# Patient Record
Sex: Female | Born: 1970 | Race: White | Hispanic: No | Marital: Married | Smoking: Former smoker
Health system: Southern US, Community
[De-identification: ages and names within clinical notes are randomized; demographics above are authoritative.]

## PROBLEM LIST (undated history)

## (undated) DIAGNOSIS — G43909 Migraine, unspecified, not intractable, without status migrainosus: Secondary | ICD-10-CM

## (undated) DIAGNOSIS — Z8661 Personal history of infections of the central nervous system: Secondary | ICD-10-CM

## (undated) DIAGNOSIS — Z8619 Personal history of other infectious and parasitic diseases: Secondary | ICD-10-CM

## (undated) DIAGNOSIS — Z8632 Personal history of gestational diabetes: Secondary | ICD-10-CM

## (undated) DIAGNOSIS — F458 Other somatoform disorders: Secondary | ICD-10-CM

## (undated) DIAGNOSIS — Z8614 Personal history of Methicillin resistant Staphylococcus aureus infection: Secondary | ICD-10-CM

## (undated) DIAGNOSIS — N200 Calculus of kidney: Secondary | ICD-10-CM

## (undated) DIAGNOSIS — M7710 Lateral epicondylitis, unspecified elbow: Secondary | ICD-10-CM

## (undated) HISTORY — PX: OTHER SURGICAL HISTORY: SHX169

## (undated) HISTORY — DX: Calculus of kidney: N20.0

## (undated) HISTORY — DX: Personal history of Methicillin resistant Staphylococcus aureus infection: Z86.14

## (undated) HISTORY — DX: Personal history of infections of the central nervous system: Z86.61

## (undated) HISTORY — PX: PELVIC LAPAROSCOPY: SHX162

## (undated) HISTORY — DX: Personal history of gestational diabetes: Z86.32

## (undated) HISTORY — PX: BACK SURGERY: SHX140

## (undated) HISTORY — DX: Other somatoform disorders: F45.8

## (undated) HISTORY — DX: Migraine, unspecified, not intractable, without status migrainosus: G43.909

## (undated) HISTORY — DX: Lateral epicondylitis, unspecified elbow: M77.10

## (undated) HISTORY — DX: Personal history of other infectious and parasitic diseases: Z86.19

---

## 1997-11-23 ENCOUNTER — Other Ambulatory Visit: Admission: RE | Admit: 1997-11-23 | Discharge: 1997-11-23 | Payer: Self-pay | Admitting: Gynecology

## 1997-11-30 ENCOUNTER — Other Ambulatory Visit: Admission: RE | Admit: 1997-11-30 | Discharge: 1997-11-30 | Payer: Self-pay | Admitting: Gynecology

## 1998-04-14 ENCOUNTER — Encounter: Admission: RE | Admit: 1998-04-14 | Discharge: 1998-07-13 | Payer: Self-pay | Admitting: Gynecology

## 1998-04-20 ENCOUNTER — Inpatient Hospital Stay (HOSPITAL_COMMUNITY): Admission: AD | Admit: 1998-04-20 | Discharge: 1998-04-20 | Payer: Self-pay | Admitting: Gynecology

## 1998-05-13 ENCOUNTER — Inpatient Hospital Stay (HOSPITAL_COMMUNITY): Admission: AD | Admit: 1998-05-13 | Discharge: 1998-05-18 | Payer: Self-pay | Admitting: Gynecology

## 1998-05-29 ENCOUNTER — Inpatient Hospital Stay (HOSPITAL_COMMUNITY): Admission: AD | Admit: 1998-05-29 | Discharge: 1998-05-29 | Payer: Self-pay | Admitting: Gynecology

## 1998-06-06 ENCOUNTER — Observation Stay (HOSPITAL_COMMUNITY): Admission: AD | Admit: 1998-06-06 | Discharge: 1998-06-07 | Payer: Self-pay | Admitting: Gynecology

## 1998-06-07 ENCOUNTER — Inpatient Hospital Stay (HOSPITAL_COMMUNITY): Admission: AD | Admit: 1998-06-07 | Discharge: 1998-06-07 | Payer: Self-pay | Admitting: Gynecology

## 1998-06-08 ENCOUNTER — Inpatient Hospital Stay (HOSPITAL_COMMUNITY): Admission: AD | Admit: 1998-06-08 | Discharge: 1998-06-11 | Payer: Self-pay | Admitting: Gynecology

## 1998-07-19 ENCOUNTER — Other Ambulatory Visit: Admission: RE | Admit: 1998-07-19 | Discharge: 1998-07-19 | Payer: Self-pay | Admitting: Gynecology

## 2000-12-03 ENCOUNTER — Encounter (INDEPENDENT_AMBULATORY_CARE_PROVIDER_SITE_OTHER): Payer: Self-pay

## 2000-12-03 ENCOUNTER — Ambulatory Visit (HOSPITAL_COMMUNITY): Admission: RE | Admit: 2000-12-03 | Discharge: 2000-12-03 | Payer: Self-pay | Admitting: Gynecology

## 2002-07-08 ENCOUNTER — Other Ambulatory Visit: Admission: RE | Admit: 2002-07-08 | Discharge: 2002-07-08 | Payer: Self-pay | Admitting: Gynecology

## 2003-07-13 ENCOUNTER — Other Ambulatory Visit: Admission: RE | Admit: 2003-07-13 | Discharge: 2003-07-13 | Payer: Self-pay | Admitting: Gynecology

## 2004-07-20 ENCOUNTER — Other Ambulatory Visit: Admission: RE | Admit: 2004-07-20 | Discharge: 2004-07-20 | Payer: Self-pay | Admitting: Gynecology

## 2005-07-22 ENCOUNTER — Other Ambulatory Visit: Admission: RE | Admit: 2005-07-22 | Discharge: 2005-07-22 | Payer: Self-pay | Admitting: Gynecology

## 2005-12-04 ENCOUNTER — Encounter: Payer: Self-pay | Admitting: Gynecology

## 2007-12-30 ENCOUNTER — Other Ambulatory Visit: Admission: RE | Admit: 2007-12-30 | Discharge: 2007-12-30 | Payer: Self-pay | Admitting: Gynecology

## 2008-04-05 HISTORY — PX: ENDOMETRIAL ABLATION: SHX621

## 2008-04-12 ENCOUNTER — Ambulatory Visit: Payer: Self-pay | Admitting: Gynecology

## 2008-04-20 ENCOUNTER — Ambulatory Visit: Payer: Self-pay | Admitting: Gynecology

## 2008-05-11 ENCOUNTER — Ambulatory Visit: Payer: Self-pay | Admitting: Gynecology

## 2009-01-17 ENCOUNTER — Ambulatory Visit: Payer: Self-pay | Admitting: Gynecology

## 2009-01-17 ENCOUNTER — Encounter: Payer: Self-pay | Admitting: Gynecology

## 2009-01-17 ENCOUNTER — Other Ambulatory Visit: Admission: RE | Admit: 2009-01-17 | Discharge: 2009-01-17 | Payer: Self-pay | Admitting: Gynecology

## 2009-02-02 HISTORY — PX: VAGINAL HYSTERECTOMY: SUR661

## 2009-02-07 ENCOUNTER — Ambulatory Visit: Payer: Self-pay | Admitting: Gynecology

## 2009-02-14 ENCOUNTER — Ambulatory Visit: Payer: Self-pay | Admitting: Gynecology

## 2009-02-20 ENCOUNTER — Ambulatory Visit: Payer: Self-pay | Admitting: Gynecology

## 2009-02-20 ENCOUNTER — Ambulatory Visit (HOSPITAL_COMMUNITY): Admission: RE | Admit: 2009-02-20 | Discharge: 2009-02-21 | Payer: Self-pay | Admitting: Gynecology

## 2009-02-20 ENCOUNTER — Encounter: Payer: Self-pay | Admitting: Gynecology

## 2009-03-14 ENCOUNTER — Ambulatory Visit: Payer: Self-pay | Admitting: Gynecology

## 2009-03-28 ENCOUNTER — Ambulatory Visit: Payer: Self-pay | Admitting: Gynecology

## 2009-04-11 ENCOUNTER — Ambulatory Visit: Payer: Self-pay | Admitting: Gynecology

## 2009-09-12 ENCOUNTER — Ambulatory Visit: Payer: Self-pay | Admitting: Gynecology

## 2009-09-21 ENCOUNTER — Encounter: Admission: RE | Admit: 2009-09-21 | Discharge: 2009-09-21 | Payer: Self-pay | Admitting: Gynecology

## 2010-08-26 ENCOUNTER — Encounter: Payer: Self-pay | Admitting: Gynecology

## 2010-11-11 LAB — CBC
HCT: 36.4 % (ref 36.0–46.0)
HCT: 43.3 % (ref 36.0–46.0)
Hemoglobin: 12.3 g/dL (ref 12.0–15.0)
Hemoglobin: 14.6 g/dL (ref 12.0–15.0)
MCHC: 33.8 g/dL (ref 30.0–36.0)
MCHC: 33.8 g/dL (ref 30.0–36.0)
MCV: 90.3 fL (ref 78.0–100.0)
MCV: 90.7 fL (ref 78.0–100.0)
Platelets: 318 10*3/uL (ref 150–400)
Platelets: 327 10*3/uL (ref 150–400)
RBC: 4.03 MIL/uL (ref 3.87–5.11)
RBC: 4.77 MIL/uL (ref 3.87–5.11)
RDW: 13.5 % (ref 11.5–15.5)
RDW: 13.6 % (ref 11.5–15.5)
WBC: 17.4 10*3/uL — ABNORMAL HIGH (ref 4.0–10.5)
WBC: 8.2 10*3/uL (ref 4.0–10.5)

## 2010-12-18 NOTE — Op Note (Signed)
Jessica Gross, Jessica Gross              ACCOUNT NO.:  [id number]   MEDICAL RECORD NO.:  [id number]          PATIENT TYPE:  OIB   LOCATION:  9306                          FACILITY:  [facility]   PHYSICIAN:  Timothy P. Fontaine, M.D.DATE OF BIRTH:  [birth year]   DATE OF PROCEDURE:  02/20/2009  DATE OF DISCHARGE:                               OPERATIVE REPORT   PREOPERATIVE DIAGNOSES:  Menorrhagia, dysmenorrhea.   POSTOPERATIVE DIAGNOSES:  Menorrhagia, dysmenorrhea.   PROCEDURE:  Total vaginal hysterectomy.   SURGEON:  Timothy P. Fontaine, MD   ASSISTANT:  Rande Brunt. Gottsegen, MD   COMPLICATIONS:  None.   ESTIMATED BLOOD LOSS:  100 mL.   SPECIMEN:  Uterus.   FINDINGS:  EUA, external BUS.  Vagina normal.  Cervix normal.  Bimanual  uterus normal size, midline and mobile.  Adnexa without masses.  Surgical cul-de-sac grossly normal to limited inspection.  Right and  left ovaries grossly normal.  Right and left fallopian tubes grossly  normal.  No evidence of adhesions or pelvic endometriosis.  Uterus;  boggy, upper limits in normal size, suggestive of adenomyosis.  No gross  pathology visualized.  Cervix grossly normal.   PROCEDURE IN DETAILS:  The patient was taken to the operating room,  underwent general anesthesia, placed in the dorsal lithotomy position,  received perineal and vaginal preparation with Betadine solution.  Bladder emptied with an indwelling Foley catheterization, placed in  sterile technique.  The patient was draped in usual fashion.  Cervix  visualized with a weighted speculum, grasped with a tenaculum and the  cervical mucosa was circumferentially injected using lidocaine with  epinephrine mixture total of 10 mL.  The cervical mucosa was then  circumferentially incised and the paracervical planes were sharply  developed.  The posterior cul-de-sac was sharply entered.  A long  weighted speculum placed and the right and left uterosacral ligaments  were identified,  clamped, cut and ligated using 0 Vicryl suture and  tagged for future reference.  The anterior cul-de-sac was then sharply  developed and ultimately entered without difficulty and the uterus was  progressively freed from its attachments through progressive clamping,  cutting and ligating of the cardinal ligaments, parametrial tissues  using 0 Vicryl suture.  The uterine arteries were identified and  separately doubly ligated using 0 Vicryl suture.  The uterus was then  delivered through the vagina and the remaining uterine and ovarian  pedicles were doubly clamped, cut and doubly ligated using 0 Vicryl  suture, and the uterus was removed from the patient.  The longer  weighted speculum was replaced.  The shorter weighted speculum and the  posterior vaginal cuff grasped with an Allis clamp.  The intestines  packed from the cul-de-sac using a tagged moist tail sponge and the  posterior vaginal cuff was run from uterosacral ligament to uterosacral  ligament using a 0 Vicryl suture in a running interlocking stitch.  Irrigation of the pelvis, adequate hemostasis.  All pedicles were  reinspected showing adequate hemostasis.  The packing was removed and  the vagina was closed anterior to posterior using 0 Vicryl suture in  interrupted  figure-of-eight stitch.  The vagina again was irrigated.  Hemostasis visualized.  The patient placed in the supine position, clear  yellow urine noted.  She was awakened without difficulty and taken to  recovery room in good condition having tolerated the procedure well      Timothy P. Fontaine, M.D.  Electronically Signed     TPF/MEDQ  D:  02/20/2009  T:  02/20/2009  Job:  [id number]

## 2010-12-18 NOTE — H&P (Signed)
NAMESHERLENE, RICKEL              ACCOUNT NO.:  [id number]   MEDICAL RECORD NO.:  [id number]          PATIENT TYPE:  AMB   LOCATION:  SDC                           [facility]ILITY:  [facility]   PHYSICIAN:  Timothy P. Fontaine, M.D.DATE OF BIRTH:  Oct 05, 1970   DATE OF ADMISSION:  DATE OF DISCHARGE:                              HISTORY & PHYSICAL   CHIEF COMPLAINT:  Dysmenorrhea, menorrhagia.   HISTORY OF PRESENT ILLNESS:  A 40 year old G6, P3, AB3 female, status  post her option endometrial ablation with worsening dysmenorrhea and  menorrhagia.  The patient underwent sonohysterogram, which showed no  cavitary defects.  Ultrasound was consistent with adenomyosis in  endometrial sampling, which showed no endometrial tissue, although her  endometrial stripe was 4.9 mm on ultrasound.  Options for management of  her worsening dysmenorrhea and menorrhagia following endometrial  ablation was discussed to include conservative attempts such as possible  IUD, re-endometrial ablation up to including hysterectomy and she wants  to proceed with hysterectomy for definitive treatment.   PAST MEDICAL HISTORY:  Uncomplicated.   PAST SURGICAL HISTORY:  Laparoscopic ovarian cystectomy, lysis of  adhesions, her option endometrial ablation.   CURRENT MEDICATIONS:  Augmentin 875 for recent diagnosis of sinusitis.   ALLERGIES:  VICODIN causes nausea and vomiting, and SULFA drugs.   REVIEW OF SYSTEMS:  Noncontributory.   FAMILY HISTORY:  Noncontributory.   SOCIAL HISTORY:  Noncontributory.   ADMISSION PHYSICAL EXAMINATION:  VITAL SIGNS:  Afebrile.  Vital signs  are stable.  HEENT:  Changes consistent with left otitis media and periorbital  tenderness consistent with sinusitis and otitis media.  LUNGS:  Clear.  CARDIAC:  Regular rate.  No rubs, murmurs, or gallops.  ABDOMEN:  Benign.  PELVIC:  External BUS, vagina normal.  Cervix normal.  Uterus normal  size, midline, mobile, nontender.  Adnexa without  masses or tenderness.   ASSESSMENT:  A 40 year old G6, P3, AB3 female, vasectomy birth control  underwent endometrial ablation for worsening menorrhagia in September  2009, which unfortunately was unsuccessful.  She had continued bleeding  and pain.  Re-ultrasound was consistent with adenomyosis, thin  endometrium, sonohysterogram without cavitary defects, endometrial  sampling was insufficient.  Options for management to include  conservative attempts, hormonal manipulation, re-ablation, Jearld Adjutant IUD  versus definitive surgery were reviewed, and she wants to proceed with  hysterectomy.  She was recently diagnosed with sinusitis and otitis and  has been treated with a week's worth of antibiotics for this and is  ready to proceed with surgery.  Long-term and short-term issues with  hysterectomy were reviewed with the patient.  She understands the  absolute irreversible sterility.  Sexuality following hysterectomy was  reviewed to include the potential for persistent orgasmic dysfunction as  well as persistent dyspareunia for which she understands and accepts.  The ovarian conservation issue was reviewed with her.  The option for  keeping both ovaries or removing one or both ovaries was discussed.  She  understands by keeping both ovaries, she will have continued hormone  production, but will have the risk of ovarian disease in the future  causing pain,  possible reoperation as well as the risk of ovarian cancer  lifetime.  If we remove both ovaries and the issues of hypoestrogenism  with symptoms such as hot flashes, sleep disturbances, accelerated  cardiovascular risks as well as accelerated osteoporosis risk was  reviewed and the patient desires to keep both ovaries, although she does  give me permission to remove one or both ovaries at the time of surgery  if intraoperatively my best judgment is to do so, but again she wants to  keep both ovaries and accepts the risk of ovarian cancer and  benign  ovarian disease in the future.  She understands that we will attempt a  vaginal hysterectomy, but at any time, if it becomes unsafe or  complications arise that we may convert this to an LAVH or a total  abdominal hysterectomy with larger incision and she understands and  accepts this.  The acute risks of surgery were reviewed to include  infection requiring prolonged antibiotics, abscess, or hematoma  formation requiring reoperation, abscess, hematoma, drainage, hemorrhage  necessitating transfusion and the risks of transfusion including  transfusion reaction, hepatitis, HIV, mad cow disease and other unknown  entities.  The risks of wound complications if indeed abdominal  incisions are made requiring opening and draining of incisions, closure  by secondary intention, long-term issues of cosmetics, keloid formation,  and hernia formation was all discussed, understood, and accepted.  The  risk of inadvertent injury to internal organs including bowel, bladder,  ureters, vessels, and nerves necessitating major exploratory reparative  surgeries either immediately recognized or delay recognized such as  bowel resection, bowel repair, bladder repair, ureteral damage repair,  reimplantation up to including ostomy formation was all discussed,  understood, and accepted.  The patient's questions were answered to her  satisfaction.  She is ready to proceed with surgery.      Timothy P. Fontaine, M.D.  Electronically Signed     TPF/MEDQ  D:  02/14/2009  T:  02/15/2009  Job:  [id number]

## 2010-12-18 NOTE — Discharge Summary (Signed)
NAMEMONTIE, GELARDI              ACCOUNT NO.:  [id number]   MEDICAL RECORD NO.:  [id number]          PATIENT TYPE:  OIB   LOCATION:  9306                          [facility]ILITY:  [facility]   PHYSICIAN:  Timothy P. Fontaine, M.D.DATE OF BIRTH:  1971-02-13   DATE OF ADMISSION:  02/20/2009  DATE OF DISCHARGE:  02/21/2009                               DISCHARGE SUMMARY   DISCHARGE DIAGNOSES:  Menorrhagia, dysmenorrhea.   PROCEDURE:  Total vaginal hysterectomy, February 20, 2009, pathology WLS-  2414 uterine weight 140 grams, adenomyosis, leiomyoma.   HOSPITAL COURSE:  A 40 year old with increasing menorrhagia,  dysmenorrhea, underwent uncomplicated TVH on February 20, 2009.  The  patient's postoperative course was uncomplicated.  She was discharged on  postoperative day #1, ambulating well, tolerating a regular diet with  postoperative hemoglobin of 12.3.  She received precautions,  instructions, and followup and will be seen in the office 2 weeks after  discharge and received a prescription for Tylox #25 one to two p.o. q.4-  6 h. p.r.n. pain.      Timothy P. Fontaine, M.D.  Electronically Signed     TPF/MEDQ  D:  02/21/2009  T:  02/21/2009  Job:  [id number]

## 2010-12-21 NOTE — Op Note (Signed)
[facility]  Patient:    Jessica Gross, Jessica Gross                 MRN: [id number] Proc. Date: 12/03/00 Adm. Date:  [id number] Attending:  Merrily Pew                           Operative Report  PREOPERATIVE DIAGNOSES:       1. Pelvic pain.                               2. Rule out endometriosis.  POSTOPERATIVE DIAGNOSES:      1. Pelvic adhesions.                               2. Right ovarian cyst.  PROCEDURE:                    1. Laparoscopic right ovarian cystectomy                               2. Lysis and excision of adhesions.  SURGEON:                      Timothy P. Fontaine, M.D.  ANESTHESIA:                   General endotracheal.  COMPLICATIONS:                None.  ESTIMATED BLOOD LOSS:         Minimal.  SPECIMEN:                     1. Pelvic adhesions.                               2. Right ovarian cyst wall.  FINDINGS:                     Anterior cul-de-sac normal. Posterior cul-de-sac with window in the peritoneum and a veil of thin adhesions between midposterior cul-de-sac and sigmoid. Uterus normal size, shape, and contour. Right and left fallopian tubes normal length, caliber, fimbriated ends. Right and left ovaries grossly normal. Right ovary with 2 cm simple-appearing cyst, excised. No evidence of overt endometriosis. Appendix grossly normal. Liver smooth, no abnormalities. Gallbladder grossly normal.  DESCRIPTION OF PROCEDURE:     The patient was taken to the operating room, properly identified, underwent general endotracheal anesthesia, was placed in low dorsal lithotomy position, received an abdominal, perineal, and vaginal preparation with Betadine solution, bladder emptied with an in-and-out Foley catheterization, and the patient was draped in the usual fashion. A vertical infraumbilical incision was made, the Veress needle placed, its position verified with saline, and approximately 2 liters of carbon  dioxide gas was infused without difficulty. The 10 mm laparoscopic trocar was then placed without difficulty, its position verified visually. A right midline suprapubic 5 mm port was then placed under direct visualization after transillumination for the vessels without difficulty. An examination of the pelvic organs and upper abdominal exam was carried out with findings noted above. The posterior cul-de-sac adhesion was excised at its insertion, both on the cul-de-sac, the peritoneum,  and the sigmoid, and was retrieved and sent to pathology. The right ovary was then stabilized and the right ovarian cyst entered sharply, draining clear cystic fluid. The cyst wall was then stripped free from the ovarian stroma and sent to pathology. Bipolar cauterization was then applied to the ovarian base to achieve adequate hemostasis. The pelvis was irrigated and subsequently Interceed was then placed over the ovarian site without difficulty. The suprapubic port was then removed, the gas allowed to escape, all sites reinspected under a low pressure situation showing adequate hemostasis, and the infraumbilical port was then backed out under direct visualization showing adequate hemostasis and no evidence of hernia formation. Both skin incisions were infiltrated using 0.25% Marcaine and a 0 Vicryl interrupted subcutaneous fascial stitch was placed infraumbilically. Both skin incisions were then reapproximated using Dermabond skin cement. The Hulka tenaculum was removed from the cervix, which was placed earlier in the case, the patient was placed in the supine position, awakened without difficulty, and was taken to the recovery room in good condition having tolerated the procedure well. DD:  12/03/00 TD:  12/04/00 Job: [id number]

## 2010-12-21 NOTE — H&P (Signed)
[facility]  Patient:    Jessica Gross, Jessica Gross                       MRN: [id number] Adm. Date:  12/03/00 Attending:  Marcial Pacas P. Fontaine, M.D.                         History and Physical  REASON FOR ADMISSION:         Being admitted to [facility], Dec 04, 1999, 1 p.m., for day surgery.  CHIEF COMPLAINT:              Increasing dysmenorrhea, dyspareunia.  HISTORY OF PRESENT ILLNESS:   [age]-year-old G6, P3, Ab3 female with an eight-month history of increasing dysmenorrhea and heavier menstrual flow. Patient notes that for the first three days of her cycle, she is having increasing worsening pelvic pain, particularly on the left, which is becoming disabling to her.  She has tried prescription-strength nonsteroidal anti-inflammatories which do not seem to relieve the pain.  Patient also notes over the last several months, increasing left dyspareunia with every coital episode as if something is being hit.  Outpatient evaluation includes ultrasonography which is normal, with a thin endometrial stripe, no evidence of ovarian and/or uterine pathology.  Patient also notes several family members with endometriosis.  Patient is admitted at this time for laser video laparoscopy.  PAST MEDICAL HISTORY:         Migraines for which she is currently asymptomatic and PMS for which she takes Serophene 10 mg x 14 days each month.   ALLERGIES:                    SULFA DRUGS.  PAST SURGICAL HISTORY:        Excision of lymph node under right arm.  REVIEW OF SYSTEMS:            Noncontributory.  FAMILY HISTORY:               Significant for endometriosis.  SOCIAL HISTORY:               Half-pack-per-day cigarette use.  No significant alcohol use.  PHYSICAL EXAMINATION:  HEENT:                        Normal.  LUNGS:                        Clear.  CARDIAC:                      Regular rate, without rubs, murmurs or gallops.  ABDOMEN:                       Exam benign.  PELVIC:                       External, BUS, vagina normal.  Cervix normal. Uterus normal size, nontender.  Adnexa without masses or tenderness. Rectovaginal exam is normal.  ASSESSMENT:                   A 40 year old gravida 6, para 3, abortus 3 female, increasing dysmenorrhea, now with deep dyspareunia, family history of endometriosis, for laser video laparoscopy.  Risks, benefits, indications and alternatives for the procedure were reviewed with her to include the expected intraoperative and postoperative courses.  The instrumentation, trocar placement, insufflation, use of sharp/blunt dissection, electrocautery as well as the use of laser were all discussed with her.  The risks of inadvertent injury to internal organs including bowel, bladder, ureters, vessels and nerves necessitating major exploratory or reparative surgeries and future reparative surgeries including ostomy formation were all discussed with her. The risks of hemorrhage necessitating transfusion and the risks of transfusion were discussed to include transfusion reaction, hepatitis, AIDS, mad cow disease were all reviewed, understood and accepted.  The risks of infection, both internal as well as incisional, requiring prolonged antibiotics as well as opening and draining of incisions or abscess formations were all discussed, understood and accepted.  No guarantees as far as pain relief were made.  The patient understands that her pain may continue, worsen or change following the procedure and she understands and accepts this.  Lastly, the patients last menstrual period was within two weeks of the procedure and I have asked her to abstain from intercourse from her last menstrual period and she agrees to do so before the procedure to avoid any pregnancy possibility. DD:  11/28/00 TD:  11/28/00 Job: [id number]

## 2012-05-27 ENCOUNTER — Other Ambulatory Visit (HOSPITAL_COMMUNITY)
Admission: RE | Admit: 2012-05-27 | Discharge: 2012-05-27 | Disposition: A | Discharge status: Home / Self Care | Payer: BC Managed Care – PPO | Source: Ambulatory Visit | Attending: Gynecology | Admitting: Gynecology

## 2012-05-27 ENCOUNTER — Encounter: Payer: Self-pay | Admitting: Gynecology

## 2012-05-27 ENCOUNTER — Ambulatory Visit (INDEPENDENT_AMBULATORY_CARE_PROVIDER_SITE_OTHER): Payer: BC Managed Care – PPO | Admitting: Gynecology

## 2012-05-27 VITALS — BP 114/74 | Ht 63.75 in | Wt 137.0 lb

## 2012-05-27 DIAGNOSIS — Z01419 Encounter for gynecological examination (general) (routine) without abnormal findings: Secondary | ICD-10-CM

## 2012-05-27 DIAGNOSIS — Z131 Encounter for screening for diabetes mellitus: Secondary | ICD-10-CM

## 2012-05-27 DIAGNOSIS — Z1151 Encounter for screening for human papillomavirus (HPV): Secondary | ICD-10-CM | POA: Insufficient documentation

## 2012-05-27 DIAGNOSIS — N764 Abscess of vulva: Secondary | ICD-10-CM

## 2012-05-27 DIAGNOSIS — Z13220 Encounter for screening for lipoid disorders: Secondary | ICD-10-CM

## 2012-05-27 LAB — LIPID PANEL
LDL Cholesterol: 143 mg/dL — ABNORMAL HIGH (ref 0–99)
Total CHOL/HDL Ratio: 5.2 Ratio

## 2012-05-27 LAB — GLUCOSE, RANDOM: Glucose, Bld: 80 mg/dL (ref 70–99)

## 2012-05-27 NOTE — Addendum Note (Signed)
Addended by: Richardson Chiquito on: 05/27/2012 03:25 PM   Modules accepted: Orders

## 2012-05-27 NOTE — Progress Notes (Signed)
Jessica Gross 1971-02-15 [id number]        41 y.o.  [id number] for annual exam.  Several issues noted below.  Past medical history,surgical history, medications, allergies, family history and social history were all reviewed and documented in the EPIC chart. ROS:  Was performed and pertinent positives and negatives are included in the history.  Exam: Sherrilyn Rist assistant Filed Vitals:   05/27/12 1359  BP: 114/74  Height: 5' 3.75" (1.619 m)  Weight: 137 lb (62.143 kg)   General appearance  Normal Skin grossly normal Head/Neck normal with no cervical or supraclavicular adenopathy thyroid normal Lungs  clear Cardiac RR, without RMG Abdominal  soft, nontender, without masses, organomegaly or hernia Breasts  examined lying and sitting without masses, retractions, discharge or axillary adenopathy. Pelvic  Ext/BUS/vagina  Small abscess upper right labia nor a junction with lower clitoral hood. Fluctuant with surrounding erythema. Mild right inguinal tenderness no overt adenopathy. Pap/HPV of cuff done  Adnexa  Without masses or tenderness    Anus and perineum  normal   Rectovaginal  normal sphincter tone without palpated masses or tenderness.    Assessment/Plan:  41 y.o. [name] female for annual exam status post TVH for menorrhagia.   1. Right small vulvar abscess. Overlying skin cleansed with Betadine infiltrated with 1% lidocaine and incised with return of pus. The area was emptied. Postoperative instructions given. We'll monitor and sitz baths. Follow up if persists/worsens or changes. 2. Mammography. Patient due as last mammogram 2011. Patient agrees to schedule. SBE monthly reviewed. 3. Pap smear.  Last Pap smear 2010. He is status post [facility] for menorrhagia. Pap/HPV done of cuff. Reviewed current guidelines and options to stop all together with screening or less frequent screening reviewed. Will readdress annually. 4. Health maintenance. Baseline CBC lipid profile glucose urinalysis  ordered. Assuming #1 resolves then follow up annually. Sooner as needed.    Dara Lords MD, 2:39 PM 05/27/2012

## 2012-05-27 NOTE — Patient Instructions (Addendum)
Follow up if lanced vaginal area becomes increasingly sore, continues to drain or has increasing redness. May use sitz baths 2-3 times daily as needed.  Follow up in one year for annual gynecologic exam.  Call to Schedule your mammogram  Facilities in [place]: 1)  [facility] [place], [place] Phone: [phone] 2)  [facility], [id number] Phone: [phone] 3)  Dr. Yolanda Bonine at [facility] [address] Phone: [phone]     Mammogram A mammogram is an X-ray test to find changes in a woman's breast. You should get a mammogram if:  You are 84 years of age or older  You have risk factors.   Your doctor recommends that you have one.  BEFORE THE TEST  Do not schedule the test the week before your period, especially if your breasts are sore during this time.  On the day of your mammogram:  Wash your breasts and armpits well. After washing, do not put on any deodorant or talcum powder on until after your test.   Eat and drink as you usually do.   Take your medicines as usual.   If you are diabetic and take insulin, make sure you:   Eat before coming for your test.   Take your insulin as usual.   If you cannot keep your appointment, call before the appointment to cancel. Schedule another appointment.  TEST  You will need to undress from the waist up. You will put on a hospital gown.   Your breast will be put on the mammogram machine, and it will press firmly on your breast with a piece of plastic called a compression paddle. This will make your breast flatter so that the machine can X-ray all parts of your breast.   Both breasts will be X-rayed. Each breast will be X-rayed from above and from the side. An X-ray might need to be taken again if the picture is not good enough.   The mammogram will last about 15 to 30 minutes.  AFTER THE TEST Finding out the results  of your test Ask when your test results will be ready. Make sure you get your test results.  Document Released: 10/18/2008 Document Revised: 07/11/2011 Document Reviewed: 10/18/2008 [facility] Patient Information 2012 [place], [place].

## 2012-05-28 ENCOUNTER — Other Ambulatory Visit: Payer: Self-pay | Admitting: *Deleted

## 2012-05-28 DIAGNOSIS — E7800 Pure hypercholesterolemia, unspecified: Secondary | ICD-10-CM

## 2012-05-28 LAB — CBC WITH DIFFERENTIAL/PLATELET
Basophils Relative: 1 % (ref 0–1)
Eosinophils Absolute: 0.4 10*3/uL (ref 0.0–0.7)
Eosinophils Relative: 4 % (ref 0–5)
MCH: 29.5 pg (ref 26.0–34.0)
MCHC: 33.5 g/dL (ref 30.0–36.0)
MCV: 88 fL (ref 78.0–100.0)
Monocytes Relative: 4 % (ref 3–12)
Neutrophils Relative %: 65 % (ref 43–77)
Platelets: 370 10*3/uL (ref 150–400)

## 2012-05-28 LAB — URINALYSIS W MICROSCOPIC + REFLEX CULTURE
Glucose, UA: NEGATIVE mg/dL
Hgb urine dipstick: NEGATIVE
Leukocytes, UA: NEGATIVE
Nitrite: NEGATIVE
Protein, ur: NEGATIVE mg/dL

## 2012-05-29 LAB — URINE CULTURE: Colony Count: 2000

## 2013-08-16 ENCOUNTER — Ambulatory Visit (INDEPENDENT_AMBULATORY_CARE_PROVIDER_SITE_OTHER): Payer: BC Managed Care – PPO | Admitting: Gynecology

## 2013-08-16 ENCOUNTER — Encounter: Payer: Self-pay | Admitting: Gynecology

## 2013-08-16 VITALS — BP 118/74 | Ht 64.0 in | Wt 142.0 lb

## 2013-08-16 DIAGNOSIS — Z01419 Encounter for gynecological examination (general) (routine) without abnormal findings: Secondary | ICD-10-CM

## 2013-08-16 DIAGNOSIS — N951 Menopausal and female climacteric states: Secondary | ICD-10-CM

## 2013-08-16 NOTE — Progress Notes (Signed)
Joice LoftsJessica Diane Hilleary 31-Oct-1970 [id number]        43 y.o.  [id number] for annual exam.  Several issues that are below.  Past medical history,surgical history, problem list, medications, allergies, family history and social history were all reviewed and documented in the EPIC chart.  ROS:  Performed and pertinent positives and negatives are included in the history, assessment and plan .  Exam: Kim assistant Filed Vitals:   08/16/13 1516  BP: 118/74  Height: 5\' 4"  (1.626 m)  Weight: 142 lb (64.411 kg)   General appearance  Normal Skin grossly normal Head/Neck normal with no cervical or supraclavicular adenopathy thyroid normal Lungs  clear Cardiac RR, without RMG Abdominal  soft, nontender, without masses, organomegaly or hernia Breasts  examined lying and sitting without masses, retractions, discharge or axillary adenopathy. Pelvic  Ext/BUS/vagina  Normal   Adnexa  Without masses or tenderness    Anus and perineum  Normal   Rectovaginal  Normal sphincter tone without palpated masses or tenderness.    Assessment/Plan:  43 y.o. [id number] female for annual exam.   1. Status post TVH for bleeding 2010. Notes of the last several months worsening hot flushes and night sweats such that she's having difficulty sleeping at night. No weight changes, skin or hair changes. Will check baseline TSH FSH.  I reviewed the whole issue of HRT with her to include the WHI study with increased risk of stroke, heart attack, DVT and breast cancer. The ACOG and NAMS statements for lowest dose for the shortest period of time reviewed. Transdermal versus oral first-pass effect benefit discussed. If FSH is elevated I discussed preference to go ahead and start her on ERT given her young age and he benefits from a cardiovascular and bone health standpoint. I reviewed options to include transdermal versus oral. If we start her on going to start her on a estradiol patch 0.05 mg and see how she does with this. I also  discussed possibly starting her on the patch even if [facility] as long as her TSH is normal to see if maybe she is symptomatic from a lower although normal range estradiol. 2. Mammography 2011. Patient knows she's overdue and agrees to schedule. SBE monthly reviewed. 3. Pap smear 05/2012. No Pap smear done today. Status post hysterectomy for benign indications. No history of abnormal Pap smears previously. Options to stop screening altogether or less frequent screening intervals reviewed. Will readdress on an annual basis. 4. Health maintenance. Baseline CBC comprehensive metabolic panel lipid profile urinalysis TSH FSH ordered. Followup for lab results otherwise annually.   Note: This document was prepared with digital dictation and possible smart phrase technology. Any transcriptional errors that result from this process are unintentional.   Dara LordsFONTAINE,Vineeth Fell P MD, 3:59 PM 08/16/2013

## 2013-08-16 NOTE — Patient Instructions (Signed)
Office will call you with blood test results. Schedule your mammogram Followup in one year for annual exam

## 2013-08-17 ENCOUNTER — Telehealth: Payer: Self-pay

## 2013-08-17 ENCOUNTER — Other Ambulatory Visit: Payer: Self-pay | Admitting: Gynecology

## 2013-08-17 ENCOUNTER — Other Ambulatory Visit: Payer: Self-pay

## 2013-08-17 DIAGNOSIS — E7800 Pure hypercholesterolemia, unspecified: Secondary | ICD-10-CM

## 2013-08-17 DIAGNOSIS — Z1231 Encounter for screening mammogram for malignant neoplasm of breast: Secondary | ICD-10-CM

## 2013-08-17 LAB — COMPREHENSIVE METABOLIC PANEL
ALK PHOS: 60 U/L (ref 39–117)
ALT: 18 U/L (ref 0–35)
AST: 15 U/L (ref 0–37)
Albumin: 4.3 g/dL (ref 3.5–5.2)
BILIRUBIN TOTAL: 0.2 mg/dL — AB (ref 0.3–1.2)
BUN: 14 mg/dL (ref 6–23)
CO2: 21 mEq/L (ref 19–32)
CREATININE: 0.61 mg/dL (ref 0.50–1.10)
Calcium: 9 mg/dL (ref 8.4–10.5)
Chloride: 103 mEq/L (ref 96–112)
Glucose, Bld: 77 mg/dL (ref 70–99)
Potassium: 4.3 mEq/L (ref 3.5–5.3)
SODIUM: 138 meq/L (ref 135–145)
TOTAL PROTEIN: 6.6 g/dL (ref 6.0–8.3)

## 2013-08-17 LAB — LIPID PANEL
CHOL/HDL RATIO: 5 ratio
CHOLESTEROL: 201 mg/dL — AB (ref 0–200)
HDL: 40 mg/dL (ref >39)
LDL Cholesterol: 118 mg/dL — ABNORMAL HIGH (ref 0–99)
Triglycerides: 213 mg/dL — ABNORMAL HIGH (ref <150)
VLDL: 43 mg/dL — AB (ref 0–40)

## 2013-08-17 LAB — URINALYSIS W MICROSCOPIC + REFLEX CULTURE
BILIRUBIN URINE: NEGATIVE
Casts: NONE SEEN
GLUCOSE, UA: NEGATIVE mg/dL
Ketones, ur: NEGATIVE mg/dL
Leukocytes, UA: NEGATIVE
Nitrite: NEGATIVE
PROTEIN: NEGATIVE mg/dL
Specific Gravity, Urine: 1.026 (ref 1.005–1.030)
UROBILINOGEN UA: 0.2 mg/dL (ref 0.0–1.0)
pH: 5 (ref 5.0–8.0)

## 2013-08-17 LAB — CBC WITH DIFFERENTIAL/PLATELET
BASOS PCT: 1 % (ref 0–1)
Basophils Absolute: 0.1 10*3/uL (ref 0.0–0.1)
EOS ABS: 0.2 10*3/uL (ref 0.0–0.7)
EOS PCT: 2 % (ref 0–5)
HCT: 40.5 % (ref 36.0–46.0)
Hemoglobin: 14.1 g/dL (ref 12.0–15.0)
Lymphocytes Relative: 30 % (ref 12–46)
Lymphs Abs: 2.9 10*3/uL (ref 0.7–4.0)
MCH: 30.3 pg (ref 26.0–34.0)
MCHC: 34.8 g/dL (ref 30.0–36.0)
MCV: 87.1 fL (ref 78.0–100.0)
Monocytes Absolute: 0.5 10*3/uL (ref 0.1–1.0)
Monocytes Relative: 5 % (ref 3–12)
NEUTROS PCT: 62 % (ref 43–77)
Neutro Abs: 6 10*3/uL (ref 1.7–7.7)
PLATELETS: 381 10*3/uL (ref 150–400)
RBC: 4.65 MIL/uL (ref 3.87–5.11)
RDW: 13.7 % (ref 11.5–15.5)
WBC: 9.7 10*3/uL (ref 4.0–10.5)

## 2013-08-17 LAB — TSH: TSH: 0.767 u[IU]/mL (ref 0.350–4.500)

## 2013-08-17 LAB — FOLLICLE STIMULATING HORMONE: FSH: 2.2 m[IU]/mL

## 2013-08-17 MED ORDER — ESTRADIOL 0.05 MG/24HR TD PTWK: 0.0500 mg | MEDICATED_PATCH | TRANSDERMAL | Status: DC

## 2013-08-17 NOTE — Telephone Encounter (Signed)
Climara 0.05 mg patch one per week #4 refill x6

## 2013-08-17 NOTE — Telephone Encounter (Signed)
Message copied by Keenan BachelorANNAS, KATHERINE R on Tue Aug 17, 2013  2:59 PM ------      Message from: Dara LordsFONTAINE, TIMOTHY P      Created: Tue Aug 17, 2013  8:50 AM       Tell patient:      #1 her hormone levels are normal. If she would like to try a low-dose estrogen patch to see if that does not help with her symptoms let me know. Otherwise she can monitor her symptoms for now.      #2 lipid profile abnormal with borderline cholesterol triglycerides and LDL. Exercise and decreased fat in her diet will help with this. Recommend repeating a fasting lipid profile in 6 months ------

## 2013-08-17 NOTE — Telephone Encounter (Signed)
Per your note below patient said she would like to try a low dose estrogen patch.

## 2013-08-17 NOTE — Telephone Encounter (Signed)
Rx sent to pharmacy   

## 2013-08-18 LAB — URINE CULTURE

## 2013-08-31 ENCOUNTER — Ambulatory Visit
Admission: RE | Admit: 2013-08-31 | Discharge: 2013-08-31 | Disposition: A | Discharge status: Home / Self Care | Payer: BC Managed Care – PPO | Source: Ambulatory Visit

## 2013-08-31 DIAGNOSIS — Z1231 Encounter for screening mammogram for malignant neoplasm of breast: Secondary | ICD-10-CM

## 2014-06-06 ENCOUNTER — Encounter: Payer: Self-pay | Admitting: Gynecology

## 2015-11-22 DIAGNOSIS — R5383 Other fatigue: Secondary | ICD-10-CM

## 2015-11-22 DIAGNOSIS — G43909 Migraine, unspecified, not intractable, without status migrainosus: Secondary | ICD-10-CM | POA: Insufficient documentation

## 2015-11-22 DIAGNOSIS — E782 Mixed hyperlipidemia: Secondary | ICD-10-CM | POA: Insufficient documentation

## 2015-11-22 DIAGNOSIS — I10 Essential (primary) hypertension: Secondary | ICD-10-CM | POA: Insufficient documentation

## 2015-11-22 DIAGNOSIS — R5381 Other malaise: Secondary | ICD-10-CM | POA: Insufficient documentation

## 2015-12-27 ENCOUNTER — Ambulatory Visit: Payer: Self-pay | Admitting: Allergy and Immunology

## 2016-03-25 ENCOUNTER — Ambulatory Visit (INDEPENDENT_AMBULATORY_CARE_PROVIDER_SITE_OTHER): Payer: BLUE CROSS/BLUE SHIELD | Admitting: Neurology

## 2016-03-25 ENCOUNTER — Encounter: Payer: Self-pay | Admitting: Neurology

## 2016-03-25 VITALS — BP 130/68 | HR 96 | Ht 63.0 in | Wt 145.5 lb

## 2016-03-25 DIAGNOSIS — H9203 Otalgia, bilateral: Secondary | ICD-10-CM | POA: Diagnosis not present

## 2016-03-25 DIAGNOSIS — G43801 Other migraine, not intractable, with status migrainosus: Secondary | ICD-10-CM

## 2016-03-25 DIAGNOSIS — R2681 Unsteadiness on feet: Secondary | ICD-10-CM | POA: Diagnosis not present

## 2016-03-25 MED ORDER — SUMATRIPTAN SUCCINATE 3 MG/0.5ML ~~LOC~~ SOAJ: 3.0000 mg | SUBCUTANEOUS | 0 refills | Status: DC

## 2016-03-25 MED ORDER — TOPIRAMATE 25 MG PO TABS: ORAL_TABLET | ORAL | 0 refills | Status: DC

## 2016-03-25 NOTE — Progress Notes (Signed)
Chart forwarded.  

## 2016-03-25 NOTE — Patient Instructions (Signed)
Migraine Recommendations: 1.  Start topiramate 25mg  at bedtime for 7 days, then increase to 50mg  at bedtime.  Call in 5 weeks with update and we can adjust dose if needed.  Possible side effects include: impaired thinking, sedation, paresthesias (numbness and tingling) and weight loss.  It may cause dehydration and there is a small risk for kidney stones, so make sure to stay hydrated with water during the day.  There is also a very small risk for glaucoma, so if you notice any change in your vision while taking this medication, see an ophthalmologist.    2.  Stop Imitrex pill.  Take Zembrace SymTouch sumatriptan 6mg  injection at earliest onset of headache.  May repeat dose once in 1 hours if needed.  Do not exceed two injections in 24 hours. 3.  Limit use of pain relievers to no more than 2 days out of the week.  These medications include acetaminophen, ibuprofen, triptans and narcotics.  This will help reduce risk of rebound headaches. 4.  Be aware of common food triggers such as processed sweets, processed foods with nitrites (such as deli meat, hot dogs, sausages), foods with MSG, alcohol (such as wine), chocolate, certain cheeses, certain fruits (dried fruits, some citrus fruit), vinegar, diet soda. 4.  Avoid caffeine 5.  Routine exercise 6.  Proper sleep hygiene 7.  Stay adequately hydrated with water 8.  Keep a headache diary. 9.  Maintain proper stress management. 10.  Do not skip meals. 11.  Consider supplements:  Magnesium oxide 400mg  to 600mg  daily, riboflavin 400mg , Coenzyme Q 10 100mg  three times daily
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
## 2016-03-27 ENCOUNTER — Other Ambulatory Visit: Payer: Self-pay

## 2016-03-27 DIAGNOSIS — G43801 Other migraine, not intractable, with status migrainosus: Secondary | ICD-10-CM

## 2016-03-27 MED ORDER — SUMATRIPTAN SUCCINATE 3 MG/0.5ML ~~LOC~~ SOAJ: 3.0000 mg | SUBCUTANEOUS | 3 refills | Status: DC

## 2016-03-28 ENCOUNTER — Telehealth: Payer: Self-pay

## 2016-03-28 ENCOUNTER — Ambulatory Visit (INDEPENDENT_AMBULATORY_CARE_PROVIDER_SITE_OTHER): Payer: BLUE CROSS/BLUE SHIELD

## 2016-03-28 DIAGNOSIS — G43801 Other migraine, not intractable, with status migrainosus: Secondary | ICD-10-CM | POA: Diagnosis not present

## 2016-03-28 MED ORDER — KETOROLAC TROMETHAMINE 60 MG/2ML IM SOLN
60.0000 mg | Freq: Once | INTRAMUSCULAR | Status: AC
  Administered 2016-03-28: 60 mg via INTRAMUSCULAR

## 2016-03-28 MED ORDER — METOCLOPRAMIDE HCL 5 MG/ML IJ SOLN
10.0000 mg | Freq: Once | INTRAVENOUS | Status: AC
  Administered 2016-03-28: 10 mg via INTRAVENOUS

## 2016-03-28 MED ORDER — SUMATRIPTAN SUCCINATE 6 MG/0.5ML ~~LOC~~ SOAJ: SUBCUTANEOUS | 3 refills | Status: DC

## 2016-03-28 MED ORDER — DIPHENHYDRAMINE HCL 50 MG/ML IJ SOLN
25.0000 mg | Freq: Once | INTRAMUSCULAR | Status: AC
  Administered 2016-03-28: 25 mg via INTRAMUSCULAR

## 2016-03-28 NOTE — Telephone Encounter (Signed)
She can come in for a headache cocktail (if she has a driver), until [facility] is [facility]

## 2016-03-28 NOTE — Telephone Encounter (Signed)
Message relayed to patient. Verbalized understanding and denied questions.  Pt does have driver. ON their way soon.

## 2016-03-28 NOTE — Telephone Encounter (Signed)
Pt called with complaints of headache. Went to pick up [employer] and it was 400 dollars. Will provide patient with Copay card. Pt states that this headache has been pretty severe and she was unable to go to work today. Please advise.

## 2016-04-04 ENCOUNTER — Ambulatory Visit
Admission: RE | Admit: 2016-04-04 | Discharge: 2016-04-04 | Disposition: A | Discharge status: Home / Self Care | Payer: BLUE CROSS/BLUE SHIELD | Source: Ambulatory Visit | Attending: Neurology | Admitting: Neurology

## 2016-04-04 DIAGNOSIS — G43801 Other migraine, not intractable, with status migrainosus: Secondary | ICD-10-CM

## 2016-04-04 DIAGNOSIS — R2681 Unsteadiness on feet: Secondary | ICD-10-CM

## 2016-04-04 DIAGNOSIS — H9203 Otalgia, bilateral: Secondary | ICD-10-CM

## 2016-04-11 ENCOUNTER — Ambulatory Visit (INDEPENDENT_AMBULATORY_CARE_PROVIDER_SITE_OTHER): Payer: BLUE CROSS/BLUE SHIELD | Admitting: Neurology

## 2016-04-11 ENCOUNTER — Encounter: Payer: Self-pay | Admitting: Neurology

## 2016-04-11 VITALS — BP 118/74 | HR 104 | Wt 141.0 lb

## 2016-04-11 DIAGNOSIS — H9203 Otalgia, bilateral: Secondary | ICD-10-CM

## 2016-04-11 DIAGNOSIS — G43801 Other migraine, not intractable, with status migrainosus: Secondary | ICD-10-CM

## 2016-04-11 DIAGNOSIS — M26609 Unspecified temporomandibular joint disorder, unspecified side: Secondary | ICD-10-CM | POA: Diagnosis not present

## 2016-04-11 NOTE — Patient Instructions (Addendum)
1.  I really think the problem is TMJ dysfunction.  We will refer you to oral surgeon/periodontist for evaluation.  If they really don't suspect that, we can pursue other testing. 2.  In meantime, continue topiramate. 3.  Continue applying heat.  May take ibuprofen as needed but just try to limit  4.  Follow up in November as scheduled.

## 2016-04-11 NOTE — Progress Notes (Signed)
Chart forwarded.  

## 2016-04-11 NOTE — Progress Notes (Signed)
NEUROLOGY FOLLOW UP OFFICE NOTE  Jessica Gross [id number]  HISTORY OF PRESENT ILLNESS: Jessica Gross is a 45 year old right-handed woman with history of migraines who follows up for bilateral otalgia and chronic migraine.  She is accompanied by her husband who provides some history.  UPDATE: MRI of brain and IAC with and without contrast was unremarkable.  She was prescribed Topamax.  She was prescribed Zembrace SymTouch injections for acute treatment.  Since starting the topiramate, she has not had any recurrent migraines.  She does not some tingling in the hands however.    However, she continues to have severe bilateral ear pain and pain in the TMJ bilaterally.  She notes that ibuprofen and applying heat to the back of her head and neck relieves it, however she denies any neck pain.   HISTORY: In late January, she began experiencing bilateral ear pain (may occur unilaterally in either ear).  The pain feels like a stabbing in her ear.  There is associated aural fullness.  She notes a non-rhythmic tapping sound/sensation in her head.  She noted difficulty hearing in noisy areas.  There is no associated visual disturbance, tinnitus, unilateral weakness or numbness or vertigo.  However, she reports that she is more clumsy when walking, such as clipping a filing cabinet when walking by.  She does have jaw pain and has been diagnosed with TMJ dysfunction.  However, ear pain persists despite different mouth guards.  She was given several rounds of antibiotics for possible ear infections, which have not been helpful.  She also has been evaluated by ENT.  Audiometric testing was normal.  CT of neck from 01/09/16 was reportedly unremarkable (Pharynx and larynx normal, mastoid and paranasal sinuses normal and report does not mention findings consistent with Eagle syndrome).   She has longstanding history of migraines and this ear pain has been triggering her migraines.  Location varies but  may be temples or top/back of head.  It is pounding.  It lasts 4-6 days and thus has headache most days of the month.  There is associated nausea, photophobia and phonophobia.  In the past, her headaches occurred a couple of times every 2 months and responded to Excedrin but is no longer effective.  She takes sumatriptan 100mg  tablets, which are no longer too effective.  She has been taking ASA daily for headache.  She takes metoprolol daily as a preventative.  In the past, she tried Maxalt, which caused problems swallowing.  PAST MEDICAL HISTORY: Past Medical History:  Diagnosis Date  . H/O viral infection    [place] virus from [place]  . Hx gestational diabetes   . Hx MRSA infection   . Hx of viral meningitis   . Kidney stones     MEDICATIONS: Current Outpatient Prescriptions on File Prior to Visit  Medication Sig Dispense Refill  . aspirin 325 MG tablet Take 325 mg by mouth daily.    Marland Kitchen. topiramate (TOPAMAX) 25 MG tablet Take 1tablet at bedtime for 7 days, then increase to 2 tablets at bedtime 60 tablet 0   No current facility-administered medications on file prior to visit.     ALLERGIES: Allergies  Allergen Reactions  . Sulfa Antibiotics Rash    Other reaction(s): Other (See Comments) Other Reaction: RASH/SWELLING  . Sulfamethoxazole-Trimethoprim     Other reaction(s): Other (See Comments) Uncoded Allergy. Allergen: MUSHROOMS, Other Reaction: RASH/SWELLING  . Hydrocodone-Acetaminophen  Other reaction(s): Other (See Comments) Unknown  . Sulfamethoxazole     Other reaction(s): Other (See Comments) Unknown    FAMILY HISTORY: Family History  Problem Relation Age of Onset  . Hypertension Mother   . Heart block Father     SOCIAL HISTORY: Social History   Social History  . Marital status: Married    Spouse name: N/A  . Number of children: N/A  . Years of education: N/A   Occupational History  .  Not on file.   Social History Main Topics  . Smoking status: Former Games developer  . Smokeless tobacco: Never Used  . Alcohol use Yes     Comment: Rare  . Drug use: No  . Sexual activity: Yes    Birth control/ protection: Surgical     Comment: HYST   Other Topics Concern  . Not on file   Social History Narrative  . No narrative on file    REVIEW OF SYSTEMS: Constitutional: No fevers, chills, or sweats, no generalized fatigue, change in appetite Eyes: No visual changes, double vision, eye pain Ear, nose and throat: No hearing loss, ear pain, nasal congestion, sore throat Cardiovascular: No chest pain, palpitations Respiratory:  No shortness of breath at rest or with exertion, wheezes GastrointestinaI: No nausea, vomiting, diarrhea, abdominal pain, fecal incontinence Genitourinary:  No dysuria, urinary retention or frequency Musculoskeletal:  No neck pain, back pain Integumentary: No rash, pruritus, skin lesions Neurological: as above Psychiatric: No depression, insomnia, anxiety Endocrine: No palpitations, fatigue, diaphoresis, mood swings, change in appetite, change in weight, increased thirst Hematologic/Lymphatic:  No purpura, petechiae. Allergic/Immunologic: no itchy/runny eyes, nasal congestion, recent allergic reactions, rashes  PHYSICAL EXAM: Vitals:   04/11/16 0902  BP: 118/74  Pulse: (!) 104   General: No acute distress.  Patient appears well-groomed.  normal body habitus. Head:  Normocephalic/atraumatic Severe tenderness to palpation of both TMJs and posterior teeth.  IMPRESSION: Migraines are better controlled  However she continues to have some head pressure as well as continued severe bilateral ear pain, aural fullness and pain at the TMJ.  I think this is due to TMJ dysfunction.  PLAN: 1.  We will refer her to a periodontist/oral surgeon for evaluation.  2.  She may continue ibuprofen and applying heat 3.  In the meantime, she will continue topiramate as it  seems to be helping to prevent the migraines. 4.  She will follow up in November as scheduled.  15 minutes spent face to face with patient, over 50% spent counseling.  Shon Millet, DO  CC:  Feliciana Rossetti, MD

## 2016-04-11 NOTE — Progress Notes (Signed)
[facility] [address] [place] [address]. Phone: [phone] Fax: [phone]

## 2016-04-29 ENCOUNTER — Other Ambulatory Visit: Payer: Self-pay | Admitting: Neurology

## 2016-04-30 NOTE — Telephone Encounter (Signed)
Rx sent to pharmacy   

## 2016-05-29 ENCOUNTER — Other Ambulatory Visit: Payer: Self-pay | Admitting: Neurology

## 2016-06-27 ENCOUNTER — Other Ambulatory Visit: Payer: Self-pay | Admitting: Neurology

## 2016-07-01 NOTE — Telephone Encounter (Signed)
One month supply of Topmax sent to pharmacy. Pt scheduled for ov 07/03/16

## 2016-07-03 ENCOUNTER — Ambulatory Visit (INDEPENDENT_AMBULATORY_CARE_PROVIDER_SITE_OTHER): Payer: BLUE CROSS/BLUE SHIELD | Admitting: Neurology

## 2016-07-03 ENCOUNTER — Encounter: Payer: Self-pay | Admitting: Neurology

## 2016-07-03 VITALS — BP 120/84 | HR 94 | Wt 141.0 lb

## 2016-07-03 DIAGNOSIS — M26609 Unspecified temporomandibular joint disorder, unspecified side: Secondary | ICD-10-CM

## 2016-07-03 DIAGNOSIS — G43009 Migraine without aura, not intractable, without status migrainosus: Secondary | ICD-10-CM | POA: Diagnosis not present

## 2016-07-03 MED ORDER — SUMATRIPTAN SUCCINATE 100 MG PO TABS: ORAL_TABLET | ORAL | 2 refills | Status: DC

## 2016-07-03 NOTE — Progress Notes (Signed)
NEUROLOGY FOLLOW UP OFFICE NOTE  Jessica LoftsJessica Diane Yoon [id number]  HISTORY OF PRESENT ILLNESS: Jessica Gross is a 45 year old right-handed woman with history of migraines who follows up for bilateral otalgia and chronic migraine.  She is accompanied by her husband who provides some history.   UPDATE: For migraines, she is taking topiramate 50mg  at bedtime.  While migraines improved, she still had severe bilateral ear pain.  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  She is being treated with a modified mouth guard and she has had improvement.  She also takes Advil and Flexeril at night.  She has not had a headache in 3 weeks.  She has pressure in the ears but not pain.  She never picked up the sumatriptan Zembrace SymTouch injection due to cost, however she hasn't needed it anyway.  She still wakes up often at night, however.   HISTORY: In late January 2017, she began experiencing bilateral ear pain (may occur unilaterally in either ear).  The pain feels like a stabbing in her ear.  There is associated aural fullness.  She notes a non-rhythmic tapping sound/sensation in her head.  She noted difficulty hearing in noisy areas.  There is no associated visual disturbance, tinnitus, unilateral weakness or numbness or vertigo.  However, she reports that she is more clumsy when walking, such as clipping a filing cabinet when walking by.  She does have jaw pain and has been diagnosed with TMJ dysfunction.  However, ear pain persists despite different mouth guards.  She was given several rounds of antibiotics for possible ear infections, which have not been helpful.  She also has been evaluated by ENT.  Audiometric testing was normal.  CT of neck from 01/09/16 was reportedly unremarkable (Pharynx and larynx normal, mastoid and paranasal sinuses normal and report does not mention findings consistent with Eagle syndrome).   She has longstanding history of migraines and this ear pain has been  triggering her migraines.  Location varies but may be temples or top/back of head.  It is pounding.  It lasts 4-6 days and thus has headache most days of the month.  There is associated nausea, photophobia and phonophobia.  In the past, her headaches occurred a couple of times every 2 months and responded to Excedrin but is no longer effective.  She takes sumatriptan 100mg  tablets, which are no longer too effective.  She has been taking ASA daily for headache.  She takes metoprolol daily as a preventative.  In the past, she tried Maxalt, which caused problems swallowing.  Past abortive therapy:  sumatriptan 100mg  tab, Maxalt (side effects) Past preventative therapy:  none  MRI of brain and IAC with and without contrast was unremarkable.  PAST MEDICAL HISTORY: Past Medical History:  Diagnosis Date  . H/O viral infection    mirrored a virus from [place] [facility]  . Hx gestational diabetes   . Hx MRSA infection   . Hx of viral meningitis   . Kidney stones     MEDICATIONS: Current Outpatient Prescriptions on File Prior to Visit  Medication Sig Dispense Refill  . topiramate (TOPAMAX) 25 MG tablet Take 2 tablets (50 mg total) by mouth at bedtime. 60 tablet 0   No current facility-administered medications on file prior to visit.     ALLERGIES: Allergies  Allergen Reactions  . Sulfa Antibiotics Rash    Other reaction(s): Other (See Comments) Other Reaction: RASH/SWELLING  .  Sulfamethoxazole-Trimethoprim     Other reaction(s): Other (See Comments) Uncoded Allergy. Allergen: MUSHROOMS, Other Reaction: RASH/SWELLING  . Hydrocodone-Acetaminophen     Other reaction(s): Other (See Comments) Unknown  . Sulfamethoxazole     Other reaction(s): Other (See Comments) Unknown    FAMILY HISTORY: Family History  Problem Relation Age of Onset  . Hypertension Mother   . Heart block Father     SOCIAL HISTORY: Social History   Social  History  . Marital status: Married    Spouse name: N/A  . Number of children: N/A  . Years of education: N/A   Occupational History  . Not on file.   Social History Main Topics  . Smoking status: Former Games developermoker  . Smokeless tobacco: Never Used  . Alcohol use Yes     Comment: Rare  . Drug use: No  . Sexual activity: Yes    Birth control/ protection: Surgical     Comment: HYST   Other Topics Concern  . Not on file   Social History Narrative  . No narrative on file    REVIEW OF SYSTEMS: Constitutional: No fevers, chills, or sweats, no generalized fatigue, change in appetite Eyes: No visual changes, double vision, eye pain Ear, nose and throat: No hearing loss, ear pain, nasal congestion, sore throat Cardiovascular: No chest pain, palpitations Respiratory:  No shortness of breath at rest or with exertion, wheezes GastrointestinaI: No nausea, vomiting, diarrhea, abdominal pain, fecal incontinence Genitourinary:  No dysuria, urinary retention or frequency Musculoskeletal:  No neck pain, back pain Integumentary: No rash, pruritus, skin lesions Neurological: as above Psychiatric: No depression, insomnia, anxiety Endocrine: No palpitations, fatigue, diaphoresis, mood swings, change in appetite, change in weight, increased thirst Hematologic/Lymphatic:  No purpura, petechiae. Allergic/Immunologic: no itchy/runny eyes, nasal congestion, recent allergic reactions, rashes  PHYSICAL EXAM: Vitals:   07/03/16 1336  BP: 120/84  Pulse: 94   General: No acute distress.  Patient appears well-groomed.  normal body habitus. Head:  Normocephalic/atraumatic Eyes:  Fundi examined but not visualized  IMPRESSION: TMJ dysfunction Migraine without aura, stable  PLAN: 1.  Continue topiramate 50mg  at bedtime 2.  If needed, she will try sumatriptan 100mg  tablets again since they were previously effective.  Now that the TMJ is being treated, the migraines may respond to the tablets again. 3.   Continue treatment for TMJ as per the periodontist. 4.  Consider sleep evaluation. 5.  Follow up in 6 months.  15 minutes spent face to face with patient, 100% spent counseling.   Shon MilletAdam Jamison Yuhasz, DO  CC:  Feliciana RossettiGreg Grisso, MD

## 2016-07-03 NOTE — Progress Notes (Signed)
Chart forwarded.  

## 2016-07-03 NOTE — Patient Instructions (Signed)
1.  Continue topiramate 50mg  at bedtime 2.  If you should get another migraine, take sumatriptan 100mg  tablet at earliest onset of headache.  May repeat once after 2 hours if needed (do not exceed 2 tablets in 24 hours) 3.  Follow up in 6 months.

## 2016-07-27 ENCOUNTER — Other Ambulatory Visit: Payer: Self-pay | Admitting: Neurology

## 2016-07-30 NOTE — Telephone Encounter (Signed)
RX sent. Per last OV note patient to continue.

## 2016-08-27 ENCOUNTER — Other Ambulatory Visit: Payer: Self-pay | Admitting: Neurology

## 2016-09-25 ENCOUNTER — Other Ambulatory Visit: Payer: Self-pay | Admitting: Neurology

## 2016-09-25 NOTE — Telephone Encounter (Signed)
Rx sent 

## 2016-11-28 ENCOUNTER — Telehealth: Payer: Self-pay | Admitting: Neurology

## 2016-11-28 NOTE — Telephone Encounter (Signed)
Received a call from PT regarding medication: Topiramate.  Patient needs a refill of medication.Yes   Patient having side effects from medication.No   Patient calling to update [place] on medication. Insurance will not cover a 30 day supply and asks if Dr Everlena Cooper can call in a 90 day supply

## 2016-11-29 ENCOUNTER — Other Ambulatory Visit: Payer: Self-pay

## 2016-11-29 MED ORDER — TOPIRAMATE 25 MG PO TABS: 50.0000 mg | ORAL_TABLET | Freq: Every day | ORAL | 1 refills | Status: DC

## 2016-11-29 NOTE — Telephone Encounter (Signed)
Medication refill has been completed. 

## 2016-11-29 NOTE — Telephone Encounter (Signed)
Received faxed  medication refill for Topiramate ( Topamax) 25 mg from [facility].  Refills must be in 90 day supply vs 30 day.  Electronically sent medication refill for 90 day suppy with 1 refill to [facility].  Per Provider sending Medication refill for Flexeril needs to be filled by [facility].  Refaxed request denying medication refill to pharmacy.

## 2017-01-03 ENCOUNTER — Ambulatory Visit: Payer: BLUE CROSS/BLUE SHIELD | Admitting: Neurology

## 2017-01-24 ENCOUNTER — Ambulatory Visit (INDEPENDENT_AMBULATORY_CARE_PROVIDER_SITE_OTHER): Payer: BLUE CROSS/BLUE SHIELD | Admitting: Neurology

## 2017-01-24 ENCOUNTER — Encounter: Payer: Self-pay | Admitting: Neurology

## 2017-01-24 VITALS — BP 120/70 | HR 92 | Ht 63.0 in | Wt 142.0 lb

## 2017-01-24 DIAGNOSIS — G43009 Migraine without aura, not intractable, without status migrainosus: Secondary | ICD-10-CM | POA: Diagnosis not present

## 2017-01-24 DIAGNOSIS — M26609 Unspecified temporomandibular joint disorder, unspecified side: Secondary | ICD-10-CM

## 2017-01-24 NOTE — Progress Notes (Signed)
NEUROLOGY FOLLOW UP OFFICE NOTE  Jessica Gross [id number]  HISTORY OF PRESENT ILLNESS: Jessica Gross is a 46 year old right-handed woman with TMJ dysfunction who follows up for migraine.   UPDATE: Since last visit, she has not had any migraines.  However, her TMJ dysfunction has progressed.  It turned out that she was chewing on and damaging her mouth guard while she slept.  The Flexeril isn't as effective.  She has mild tension type headache due to the TMJ, which responds to Advil.  She has a scheduled sleep study next month to evaluate for any underlying sleep disorder such as OSA.  Current NSAIDS:  Advil Current analgesics:  no Current triptans:  sumatriptan 100mg  Current anti-emetic:  no Current muscle relaxants:  Flexeril Current anti-anxiolytic:  no Current sleep aide:  no Current Antihypertensive medications:  no Current Antidepressant medications:  no Current Anticonvulsant medications:  topiramate 50mg  Current Vitamins/Herbal/Supplements:  no Current Antihistamines/Decongestants:  no Other therapy:  no   HISTORY: In late January 2017, she began experiencing bilateral ear pain (may occur unilaterally in either ear).  The pain felt like a stabbing in her ear.  There was associated aural fullness.  She noted a non-rhythmic tapping sound/sensation in her head.  She noted difficulty hearing in noisy areas.  There was no associated visual disturbance, tinnitus, unilateral weakness or numbness or vertigo.  However, she reported that she is more clumsy when walking, such as clipping a filing cabinet when walking by.  She had jaw pain and was diagnosed with TMJ dysfunction.  However, ear pain persists despite different mouth guards.  She was given several rounds of antibiotics for possible ear infections, which have not been helpful.  She also has been evaluated by ENT.  Audiometric testing was normal.  CT of neck from 01/09/16 was reportedly unremarkable (Pharynx and larynx  normal, mastoid and paranasal sinuses normal and report does not mention findings consistent with Eagle syndrome).   She has longstanding history of migraines and this ear pain has been triggering her migraines.  Location varies but may be temples or top/back of head.  It is pounding.  It lasts 4-6 days and thus has headache most days of the month.  There is associated nausea, photophobia and phonophobia.  She was diagnosed with TMJ dysfunction and prescribed a mouth guard which has helped prevent the migraines.   Past abortive therapy:  Maxalt (side effects), Excedrin Past preventative therapy:  none   MRI of brain and IAC with and without contrast was unremarkable.  PAST MEDICAL HISTORY: Past Medical History:  Diagnosis Date  . 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 Hx gestational diabetes   . Hx MRSA infection   . Hx of viral meningitis   . Kidney stones     MEDICATIONS: Current Outpatient Prescriptions on File Prior to Visit  Medication Sig Dispense Refill  . cyclobenzaprine (FLEXERIL) 10 MG tablet     . ibuprofen (ADVIL,MOTRIN) 100 MG tablet Take 100 mg by mouth every 6 (six) hours as needed for fever.    . SUMAtriptan (IMITREX) 100 MG tablet Take 1 tablet at earliest onset of headache.  May repeat once after 2 hours if headache persists or recurs. 10 tablet 2  . topiramate (TOPAMAX) 25 MG tablet Take 2 tablets (50 mg total) by mouth at bedtime. 180 tablet 1  . EPINEPHrine 0.3 mg/0.3 mL IJ SOAJ  injection   prn as needed     No current facility-administered medications on file prior to visit.     ALLERGIES: Allergies  Allergen Reactions  . Sulfa Antibiotics Rash    Other reaction(s): Other (See Comments) Other Reaction: RASH/SWELLING  . Sulfamethoxazole-Trimethoprim     Other reaction(s): Other (See Comments) Uncoded Allergy. Allergen: MUSHROOMS, Other Reaction: RASH/SWELLING  .  Hydrocodone-Acetaminophen     Other reaction(s): Other (See Comments) Unknown  . Sulfamethoxazole     Other reaction(s): Other (See Comments) Unknown    FAMILY HISTORY: Family History  Problem Relation Age of Onset  . Hypertension Mother   . Heart block Father     SOCIAL HISTORY: Social History   Social History  . Marital status: Married    Spouse name: N/A  . Number of children: N/A  . Years of education: N/A   Occupational History  . Not on file.   Social History Main Topics  . Smoking status: Former Games developer  . Smokeless tobacco: Never Used  . Alcohol use Yes     Comment: Rare  . Drug use: No  . Sexual activity: Yes    Birth control/ protection: Surgical     Comment: HYST   Other Topics Concern  . Not on file   Social History Narrative  . No narrative on file    REVIEW OF SYSTEMS: Constitutional: No fevers, chills, or sweats, no generalized fatigue, change in appetite Eyes: No visual changes, double vision, eye pain Ear, nose and throat: No hearing loss, ear pain, nasal congestion, sore throat Cardiovascular: No chest pain, palpitations Respiratory:  No shortness of breath at rest or with exertion, wheezes GastrointestinaI: No nausea, vomiting, diarrhea, abdominal pain, fecal incontinence Genitourinary:  No dysuria, urinary retention or frequency Musculoskeletal:  No neck pain, back pain Integumentary: No rash, pruritus, skin lesions Neurological: as above Psychiatric: No depression, insomnia, anxiety Endocrine: No palpitations, fatigue, diaphoresis, mood swings, change in appetite, change in weight, increased thirst Hematologic/Lymphatic:  No purpura, petechiae. Allergic/Immunologic: no itchy/runny eyes, nasal congestion, recent allergic reactions, rashes  PHYSICAL EXAM: Vitals:   01/24/17 1147  BP: 120/70  Pulse: 92   General: No acute distress.  Patient appears well-groomed.  normal body habitus. Head:  Normocephalic/atraumatic Eyes:  Fundi  examined but not visualized Neck: supple, no paraspinal tenderness, full range of motion Heart:  Regular rate and rhythm Lungs:  Clear to auscultation bilaterally Back: No paraspinal tenderness Neurological Exam: alert and oriented to person, place, and time. Attention span and concentration intact, recent and remote memory intact, fund of knowledge intact.  Speech fluent and not dysarthric, language intact.  CN II-XII intact. Bulk and tone normal, muscle strength 5/5 throughout.  Sensation to light touch, temperature and vibration intact.  Deep tendon reflexes 2+ throughout, toes downgoing.  Finger to nose and heel to shin testing intact.  Gait normal, Romberg negative.  IMPRESSION: Migraine without aura, controlled TMJ dysfunction  PLAN: 1.  Continue topiramate 50mg  at bedtime 2.  Sumatriptan 100mg  as needed 3.  Continued management of TMJ by her periodontist. 4.  Follow up in 6 months.  18 minutes spent face to face with patient, over 50% spent discussing management.  Shon Millet, DO  CC:  Feliciana Rossetti, MD

## 2017-04-14 ENCOUNTER — Other Ambulatory Visit: Payer: Self-pay | Admitting: Neurology

## 2017-05-23 ENCOUNTER — Other Ambulatory Visit: Payer: Self-pay | Admitting: Neurology

## 2017-08-08 ENCOUNTER — Ambulatory Visit: Payer: BLUE CROSS/BLUE SHIELD | Admitting: Neurology

## 2017-09-09 ENCOUNTER — Encounter: Payer: Self-pay | Admitting: Neurology

## 2017-09-09 ENCOUNTER — Ambulatory Visit (INDEPENDENT_AMBULATORY_CARE_PROVIDER_SITE_OTHER): Payer: BLUE CROSS/BLUE SHIELD | Admitting: Neurology

## 2017-09-09 VITALS — BP 116/90 | HR 96 | Ht 63.0 in | Wt 148.6 lb

## 2017-09-09 DIAGNOSIS — M26609 Unspecified temporomandibular joint disorder, unspecified side: Secondary | ICD-10-CM | POA: Diagnosis not present

## 2017-09-09 DIAGNOSIS — G43009 Migraine without aura, not intractable, without status migrainosus: Secondary | ICD-10-CM

## 2017-09-09 NOTE — Progress Notes (Signed)
NEUROLOGY FOLLOW UP OFFICE NOTE  Elidia Bonenfant [id number]  HISTORY OF PRESENT ILLNESS: Jessica Gross is a 47 year old right-handed woman with TMJ dysfunction who follows up for migraine.   UPDATE: She has not had any migraines since last visit.  However, she still has tension-type headaches related to her TMJ dysfunction (not associated with nausea).  Headache in back of head.  No neck pain. She states that she may need surgery to correct that. Intensity:  Moderate to severe Duration:  1 hour Frequency:  1 to 2 days a week. Current NSAIDS:  Advil Current analgesics:  no Current triptans:  sumatriptan 100mg  Current anti-emetic:  no Current muscle relaxants:  Flexeril Current anti-anxiolytic:  no Current sleep aide:  no Current Antihypertensive medications:  no Current Antidepressant medications:  no Current Anticonvulsant medications:  topiramate 50mg  Current Vitamins/Herbal/Supplements:  no Current Antihistamines/Decongestants:  no Other therapy:  no  Depression:  no; Anxiety:  Mild anxiety related to taking on more responsibility at work. Sleep hygiene:  Had sleep study which was negative for OSA.   HISTORY: In late January 2017, she began experiencing bilateral ear pain (may occur unilaterally in either ear).  The pain felt like a stabbing in her ear.  There was associated aural fullness.  She noted a non-rhythmic tapping sound/sensation in her head.  She noted difficulty hearing in noisy areas.  There was no associated visual disturbance, tinnitus, unilateral weakness or numbness or vertigo.  However, she reported that she is more clumsy when walking, such as clipping a filing cabinet when walking by.  She had jaw pain and was diagnosed with TMJ dysfunction.  However, ear pain persists despite different mouth guards.  She was given several rounds of antibiotics for possible ear infections, which have not been helpful.  She also has been evaluated by ENT.   Audiometric testing was normal.  CT of neck from 01/09/16 was reportedly unremarkable (Pharynx and larynx normal, mastoid and paranasal sinuses normal and report does not mention findings consistent with Eagle syndrome).   She has longstanding history of migraines and this ear pain has been triggering her migraines.  Location varies but may be temples or top/back of head.  It is pounding.  It lasts 4-6 days and thus has headache most days of the month.  There is associated nausea, photophobia and phonophobia.  She was diagnosed with TMJ dysfunction and prescribed a mouth guard which has helped prevent the migraines.   Past abortive therapy:  Maxalt (side effects), Excedrin Past preventative therapy:  none   MRI of brain and IAC with and without contrast was unremarkable.  PAST MEDICAL HISTORY: Past Medical History:  Diagnosis Date  . 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 Hx gestational diabetes   . Hx MRSA infection   . Hx of viral meningitis   . Kidney stones     MEDICATIONS: Current Outpatient Medications on File Prior to Visit  Medication Sig Dispense Refill  . cyclobenzaprine (FLEXERIL) 10 MG tablet     . EPINEPHrine 0.3 mg/0.3 mL IJ SOAJ injection   prn as needed    . ibuprofen (ADVIL,MOTRIN) 100 MG tablet Take 100 mg by mouth every 6 (six) hours as needed for fever.    . SUMAtriptan (IMITREX) 100 MG tablet TAKE 1 TABLET BY MOUTH AT EARLY ONSET OF HEADACHE MAY REPEAT ONCE AFTER 2 HOURS IF HEADACHE PERSISTS 10  tablet 2  . topiramate (TOPAMAX) 25 MG tablet TAKE 2 TABLETS BY MOUTH AT BEDTIME 180 tablet 1   No current facility-administered medications on file prior to visit.     ALLERGIES: Allergies  Allergen Reactions  . Sulfa Antibiotics Rash    Other reaction(s): Other (See Comments) Other Reaction: RASH/SWELLING  . Sulfamethoxazole-Trimethoprim     Other reaction(s): Other (See  Comments) Uncoded Allergy. Allergen: MUSHROOMS, Other Reaction: RASH/SWELLING  . Hydrocodone-Acetaminophen     Other reaction(s): Other (See Comments) Unknown  . Sulfamethoxazole     Other reaction(s): Other (See Comments) Unknown    FAMILY HISTORY: Family History  Problem Relation Age of Onset  . Hypertension Mother   . Heart block Father     SOCIAL HISTORY: Social History   Socioeconomic History  . Marital status: Married    Spouse name: Not on file  . Number of children: Not on file  . Years of education: Not on file  . Highest education level: Not on file  Social Needs  . Financial resource strain: Not on file  . Food insecurity - worry: Not on file  . Food insecurity - inability: Not on file  . Transportation needs - medical: Not on file  . Transportation needs - non-medical: Not on file  Occupational History  . Not on file  Tobacco Use  . Smoking status: Former Games developer  . Smokeless tobacco: Never Used  Substance and Sexual Activity  . Alcohol use: Yes    Comment: Rare  . Drug use: No  . Sexual activity: Yes    Birth control/protection: Surgical    Comment: HYST  Other Topics Concern  . Not on file  Social History Narrative  . Not on file    REVIEW OF SYSTEMS: Constitutional: No fevers, chills, or sweats, no generalized fatigue, change in appetite Eyes: No visual changes, double vision, eye pain Ear, nose and throat: No hearing loss, ear pain, nasal congestion, sore throat Cardiovascular: No chest pain, palpitations Respiratory:  No shortness of breath at rest or with exertion, wheezes GastrointestinaI: No nausea, vomiting, diarrhea, abdominal pain, fecal incontinence Genitourinary:  No dysuria, urinary retention or frequency Musculoskeletal:  Bilateral jaw pain.  No neck pain, back pain Integumentary: No rash, pruritus, skin lesions Neurological: as above Psychiatric: No depression, insomnia, anxiety Endocrine: No palpitations, fatigue, diaphoresis,  mood swings, change in appetite, change in weight, increased thirst Hematologic/Lymphatic:  No purpura, petechiae. Allergic/Immunologic: no itchy/runny eyes, nasal congestion, recent allergic reactions, rashes  PHYSICAL EXAM: Vitals:   09/09/17 0748  BP: 116/90  Pulse: 96  SpO2: 98%   General: No acute distress.  Patient appears well-groomed.  normal body habitus. Head:  Normocephalic/atraumatic Eyes:  Fundi examined but not visualized Neck: supple, no paraspinal tenderness, full range of motion Heart:  Regular rate and rhythm Lungs:  Clear to auscultation bilaterally Back: No paraspinal tenderness Neurological Exam: alert and oriented to person, place, and time. Attention span and concentration intact, recent and remote memory intact, fund of knowledge intact.  Speech fluent and not dysarthric, language intact.  CN II-XII intact. Bulk and tone normal, muscle strength 5/5 throughout.  Sensation to light touch, temperature and vibration intact.  Deep tendon reflexes 2+ throughout, toes downgoing.  Finger to nose and heel to shin testing intact.  Gait normal, Romberg negative.  IMPRESSION: Migraine without aura, stable TMJ dysfunction contributing to headache  PLAN: 1.  Continue topiramate 50mg  at bedtime 2.  Sumatriptan 100mg  for abortive therapy. 3.  Follow up in  6 months.  Shon MilletAdam Jaffe, DO

## 2017-09-09 NOTE — Patient Instructions (Addendum)
1.  Continue topiramate 50mg  at bedtime 2.  Use sumatriptan as needed. 3.  Follow up in 6 months.

## 2017-10-24 ENCOUNTER — Other Ambulatory Visit: Payer: Self-pay | Admitting: General Surgery

## 2017-10-24 ENCOUNTER — Ambulatory Visit: Payer: Self-pay | Admitting: General Surgery

## 2017-10-24 NOTE — H&P (Signed)
History of Present Illness Axel Filler(Nalleli Largent MD; 10/24/2017 2:21 PM) The patient is a 47 year old female who presents for evaluation of gall stones. Referred by: Self Chief Complaint: Abdominal pain  Patient is a 47 year old female comes in with history of migraines, TMJ, and has a history of abdominal pain, nausea, diarrhea with high fatty foods. She states that this is also associated with reflux, bloating. Patient states that she previously had been seen a urologist secondary to kidney stones. She states that patient undergone x-rays in the past and was diagnosed with gallstones On x-ray. She never had a dedicated ultrasound.  Patient states that recently she has been staying away from any fried, high fatty foods. She's been drinking ensure for nutrition.  She had no previous abdominal surgery.    Past Surgical History Sander Nephew(Danielle Gerrigner, CMA; 10/24/2017 2:09 PM) Hysterectomy (not due to cancer) - Partial  Oral Surgery   Diagnostic Studies History Sander Nephew(Danielle Gerrigner, CMA; 10/24/2017 2:09 PM) Mammogram  1-3 years ago Pap Smear  1-5 years ago  Allergies Sander Nephew(Danielle Gerrigner, CMA; 10/24/2017 2:10 PM) Sulfa Drugs  Allergies Reconciled   Medication History Sander Nephew(Danielle Gerrigner, CMA; 10/24/2017 2:11 PM) Topiramate ER (Oral) Specific strength unknown - Active. SUMAtriptan Succinate (Oral) Specific strength unknown - Active. Ibuprofen (Oral) Specific strength unknown - Active. Multiple Vitamin (Oral) Active. Medications Reconciled  Social History Duwayne Heck(Danielle [employer] fast streamerGerrigner, CMA; 10/24/2017 2:09 PM) Alcohol use  Moderate alcohol use. Caffeine use  Carbonated beverages. Illicit drug use  Remotely quit drug use. Tobacco use  Former smoker.  Family History Sander Nephew(Danielle Gerrigner, CMA; 10/24/2017 2:09 PM) Alcohol Abuse  Mother, Sister. Arthritis  Mother. Hypertension  Father. Migraine Headache  Son.  Pregnancy / Birth History Sander Nephew(Danielle Gerrigner, CMA; 10/24/2017 2:09  PM) Age at menarche  15 years. Age of menopause  2546-50 Contraceptive History  Depo-provera, Oral contraceptives. Gravida  6 Irregular periods  Length (months) of breastfeeding  3-6 Maternal age  47-20 Para  3  Other Problems Sander Nephew(Danielle Gerrigner, CMA; 10/24/2017 2:09 PM) Cholelithiasis  General anesthesia - complications  Hemorrhoids  Kidney Stone  Migraine Headache     Review of Systems Axel Filler(Nahmir Zeidman MD; 10/24/2017 2:19 PM) General Present- Appetite Loss, Fatigue, Night Sweats and Weight Gain. Not Present- Chills, Fever and Weight Loss. Skin Not Present- Change in Wart/Mole, Dryness, Hives, Jaundice, New Lesions, Non-Healing Wounds, Rash and Ulcer. HEENT Present- Earache, Ringing in the Ears, Seasonal Allergies and Wears glasses/contact lenses. Not Present- Hearing Loss, Hoarseness, Nose Bleed, Oral Ulcers, Sinus Pain, Sore Throat, Visual Disturbances and Yellow Eyes. Respiratory Not Present- Bloody sputum, Chronic Cough, Difficulty Breathing, Snoring and Wheezing. Breast Not Present- Breast Mass, Breast Pain, Nipple Discharge and Skin Changes. Cardiovascular Not Present- Chest Pain, Difficulty Breathing Lying Down, Leg Cramps, Palpitations, Rapid Heart Rate, Shortness of Breath and Swelling of Extremities. Gastrointestinal Present- Abdominal Pain, Bloating, Change in Bowel Habits, Chronic diarrhea and Indigestion. Not Present- Bloody Stool, Constipation, Difficulty Swallowing, Excessive gas, Gets full quickly at meals, Hemorrhoids, Nausea, Rectal Pain and Vomiting. Female Genitourinary Not Present- Frequency, Nocturia, Painful Urination, Pelvic Pain and Urgency. Musculoskeletal Present- Joint Pain. Not Present- Back Pain, Joint Stiffness, Muscle Pain, Muscle Weakness and Swelling of Extremities. Neurological Present- Headaches. Not Present- Decreased Memory, Fainting, Numbness, Seizures, Tingling, Tremor, Trouble walking and Weakness. Psychiatric Not Present- Anxiety,  Bipolar, Change in Sleep Pattern, Depression, Fearful and Frequent crying. Endocrine Present- Hot flashes. Not Present- Cold Intolerance, Excessive Hunger, Hair Changes, Heat Intolerance and New Diabetes. Hematology Not Present- Blood Thinners, Easy Bruising, Excessive bleeding,  Gland problems, HIV and Persistent Infections. All other systems negative  Vitals (Danielle Gerrigner CMA; 10/24/2017 2:11 PM) 10/24/2017 2:11 PM Weight: 147 lb Height: 63in Body Surface Area: 1.7 m Body Mass Index: 26.04 kg/m  Temp.: 98.70F(Oral)  Pulse: 89 (Regular)  BP: 144/98 (Sitting, Left Arm, Standard)       Physical Exam Axel Filler MD; 10/24/2017 2:21 PM) The physical exam findings are as follows: Note:Constitutional: No acute distress, conversant, appears stated age  Eyes: Anicteric sclerae, moist conjunctiva, no lid lag  Neck: No thyromegaly, trachea midline, no cervical lymphadenopathy  Lungs: Clear to auscultation biilaterally, normal respiratory effot  Cardiovascular: regular rate & rhythm, no murmurs, no peripheal edema, pedal pulses 2+  GI: Soft, no masses or hepatosplenomegaly, non-tender to palpation  MSK: Normal gait, no clubbing cyanosis, edema  Skin: No rashes, palpation reveals normal skin turgor  Psychiatric: Appropriate judgment and insight, oriented to person, place, and time    Assessment & Plan Axel Filler MD; 10/24/2017 2:22 PM) SYMPTOMATIC CHOLELITHIASIS (K80.20) Impression: 47 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 We will obtain ultrasound to fully evaluate her gallbladder. 2. After ultrasound is reviewed We will proceed to the operating room for a laparoscopic cholecystectomy  3. Risks and benefits were discussed with the patient to generally include, but not limited to: infection, bleeding, possible need for post op ERCP, damage to the bile ducts, bile leak, and possible need for further surgery. Alternatives were offered and  described. All questions were answered and the patient voiced understanding of the procedure and wishes to proceed at this point with a laparoscopic cholecystectomy

## 2017-10-27 ENCOUNTER — Other Ambulatory Visit: Payer: Self-pay | Admitting: General Surgery

## 2017-10-27 DIAGNOSIS — K8020 Calculus of gallbladder without cholecystitis without obstruction: Secondary | ICD-10-CM

## 2017-10-31 ENCOUNTER — Ambulatory Visit
Admission: RE | Admit: 2017-10-31 | Discharge: 2017-10-31 | Disposition: A | Discharge status: Home / Self Care | Payer: BLUE CROSS/BLUE SHIELD | Source: Ambulatory Visit | Attending: General Surgery | Admitting: General Surgery

## 2017-10-31 DIAGNOSIS — K8020 Calculus of gallbladder without cholecystitis without obstruction: Secondary | ICD-10-CM

## 2017-11-03 ENCOUNTER — Other Ambulatory Visit: Payer: Self-pay | Admitting: General Surgery

## 2017-11-20 ENCOUNTER — Other Ambulatory Visit: Payer: Self-pay | Admitting: Neurology

## 2018-02-07 ENCOUNTER — Other Ambulatory Visit: Payer: Self-pay | Admitting: Neurology

## 2018-03-09 ENCOUNTER — Encounter: Payer: Self-pay | Admitting: Neurology

## 2018-03-09 ENCOUNTER — Ambulatory Visit (INDEPENDENT_AMBULATORY_CARE_PROVIDER_SITE_OTHER): Payer: BLUE CROSS/BLUE SHIELD | Admitting: Neurology

## 2018-03-09 VITALS — BP 124/90 | HR 87 | Ht 63.0 in | Wt 140.0 lb

## 2018-03-09 DIAGNOSIS — M26609 Unspecified temporomandibular joint disorder, unspecified side: Secondary | ICD-10-CM | POA: Diagnosis not present

## 2018-03-09 DIAGNOSIS — G43009 Migraine without aura, not intractable, without status migrainosus: Secondary | ICD-10-CM

## 2018-03-09 MED ORDER — TOPIRAMATE 100 MG PO TABS: 100.0000 mg | ORAL_TABLET | Freq: Every day | ORAL | 5 refills | Status: DC

## 2018-03-09 NOTE — Patient Instructions (Signed)
1.  Increase topiramate 25mg  tablets to 4 tablets at bedtime.  When you finish, then start the 100mg  tablet at bedtime 2.  Sumatriptan as needed, limited to no more than 2 days out of week to prevent rebound headache 3.  Follow up in 6 months.

## 2018-03-09 NOTE — Progress Notes (Signed)
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   UPDATE: Migraines are well-controlled.  However, she has had further degeneration of her TMJs.  She is seeking referral to a tertiary care center. Intensity:  Moderate to severe Duration:  1 hour Frequency:  No recent migraines Current NSAIDS:  Advil Current analgesics:  no Current triptans:  sumatriptan 100mg  Current anti-emetic:  no Current muscle relaxants:  no Current anti-anxiolytic:  no Current sleep aide:  no Current Antihypertensive medications:  no Current Antidepressant medications:  no Current Anticonvulsant medications:  topiramate 50mg  Current Vitamins/Herbal/Supplements:  no Current Antihistamines/Decongestants:  no Other therapy:  no   Depression:  no; Anxiety:  Mild anxiety related to taking on more responsibility at work. Sleep hygiene:  Had sleep study which was negative for OSA.   HISTORY: In late January 2017, she began experiencing bilateral ear pain (may occur unilaterally in either ear).  The pain felt like a stabbing in her ear.  There was associated aural fullness.  She noted a non-rhythmic tapping sound/sensation in her head.  She noted difficulty hearing in noisy areas.  There was no associated visual disturbance, tinnitus, unilateral weakness or numbness or vertigo.  However, she reported that she is more clumsy when walking, such as clipping a filing cabinet when walking by.  She had jaw pain and was diagnosed with TMJ dysfunction.  However, ear pain persists despite different mouth guards.  She was given several rounds of antibiotics for possible ear infections, which have not been helpful.  She also has been evaluated by ENT.  Audiometric testing was normal.  CT of neck from 01/09/16 was reportedly unremarkable (Pharynx and larynx normal, mastoid and  paranasal sinuses normal and report does not mention findings consistent with Eagle syndrome).   She has longstanding history of migraines and this ear pain has been triggering her migraines.  Location varies but may be temples or top/back of head.  It is pounding.  It lasts 4-6 days and thus has headache most days of the month.  There is associated nausea, photophobia and phonophobia.  She was diagnosed with TMJ dysfunction and prescribed a mouth guard which has helped prevent the migraines.   Past abortive therapy:  Maxalt (side effects), Excedrin Past preventative therapy:  None Other past medication:  Flexeril   MRI of brain and IAC with and without contrast was unremarkable.  PAST MEDICAL HISTORY: Past Medical History:  Diagnosis Date  . 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 Hx gestational diabetes   . Hx MRSA infection   . Hx of viral meningitis   . Kidney stones     MEDICATIONS: Current Outpatient Medications on File Prior to Visit  Medication Sig Dispense Refill  . cyclobenzaprine (FLEXERIL) 10 MG tablet     . EPINEPHrine 0.3 mg/0.3 mL IJ SOAJ injection   prn as needed    . ibuprofen (ADVIL,MOTRIN) 100 MG tablet Take 100 mg by mouth every 6 (six) hours as needed for fever.    . SUMAtriptan (IMITREX) 100 MG tablet TAKE 1 TABLET BY MOUTH AT EARLY ONSET OF HEADACHE MAY REPEAT ONCE AFTER 2 HOURS IF HEADACHE PERSISTS 10 tablet 2   No current facility-administered medications on file prior to visit.     ALLERGIES: Allergies  Allergen Reactions  . Sulfa Antibiotics Rash    Other reaction(s): Other (See Comments) Other Reaction: RASH/SWELLING  . Sulfamethoxazole-Trimethoprim     Other reaction(s): Other (See Comments) Uncoded Allergy. Allergen: MUSHROOMS, Other Reaction: RASH/SWELLING  . Hydrocodone-Acetaminophen     Other reaction(s): Other (See Comments) Unknown  . Sulfamethoxazole      Other reaction(s): Other (See Comments) Unknown    FAMILY HISTORY: Family History  Problem Relation Age of Onset  . Hypertension Mother   . Heart block Father     SOCIAL HISTORY: Social History   Socioeconomic History  . Marital status: Married    Spouse name: Not on file  . Number of children: Not on file  . Years of education: Not on file  . Highest education level: Not on file  Occupational History  . Not on file  Social Needs  . Financial resource strain: Not on file  . Food insecurity:    Worry: Not on file    Inability: Not on file  . Transportation needs:    Medical: Not on file    Non-medical: Not on file  Tobacco Use  . Smoking status: Former Games developermoker  . Smokeless tobacco: Never Used  Substance and Sexual Activity  . Alcohol use: Yes    Comment: Rare  . Drug use: No  . Sexual activity: Yes    Birth control/protection: Surgical    Comment: HYST  Lifestyle  . Physical activity:    Days per week: Not on file    Minutes per session: Not on file  . Stress: Not on file  Relationships  . Social connections:    Talks on phone: Not on file    Gets together: Not on file    Attends religious service: Not on file    Active member of club or organization: Not on file    Attends meetings of clubs or organizations: Not on file    Relationship status: Not on file  . Intimate partner violence:    Fear of current or ex partner: Not on file    Emotionally abused: Not on file    Physically abused: Not on file    Forced sexual activity: Not on file  Other Topics Concern  . Not on file  Social History Narrative  . Not on file    REVIEW OF SYSTEMS: Constitutional: No fevers, chills, or sweats, no generalized fatigue, change in appetite Eyes: No visual changes, double vision, eye pain Ear, nose and throat: No hearing loss, ear pain, nasal congestion, sore throat Cardiovascular: No chest pain, palpitations Respiratory:  No shortness of breath at rest or with  exertion, wheezes GastrointestinaI: No nausea, vomiting, diarrhea, abdominal pain, fecal incontinence Genitourinary:  No dysuria, urinary retention or frequency Musculoskeletal:  No neck pain, back pain Integumentary: No rash, pruritus, skin lesions Neurological: as above Psychiatric: No depression, insomnia, anxiety Endocrine: No palpitations, fatigue, diaphoresis, mood swings, change in appetite, change in weight, increased thirst Hematologic/Lymphatic:  No purpura, petechiae. Allergic/Immunologic: no itchy/runny eyes, nasal congestion, recent allergic reactions, rashes  PHYSICAL EXAM: Vitals:   03/09/18 1417  BP: 124/90  Pulse: 87  SpO2: 97%   General: No acute distress.  Patient appears well-groomed.   Head:  Normocephalic/atraumatic Eyes:  Fundi examined but not visualized Neck: supple,tenderness, full range of motion Heart:  Regular rate and rhythm Lungs:  Clear to auscultation bilaterally Back: No paraspinal tenderness Neurological Exam: alert and oriented to person, place, and time. Attention span and concentration intact, recent and remote  memory intact, fund of knowledge intact.  Speech fluent and not dysarthric, language intact.  CN II-XII intact. Bulk and tone normal, muscle strength 5/5 throughout.  Sensation to light touch  intact.  Deep tendon reflexes 2+ throughout.  Finger to nose testing intact.  Gait normal, Romberg negative.  IMPRESSION: Migraine without aura, not intractable TMJ dysfunction  PLAN: 1.  Increase topiramate 25mg  tablets to 4 tablets at bedtime.  When you finish, then start the 100mg  tablet at bedtime 2.  Sumatriptan as needed, limited to no more than 2 days out of week to prevent rebound headache 3.  Follow up in 6 months.  Shon Millet, DO

## 2018-05-25 ENCOUNTER — Other Ambulatory Visit: Payer: Self-pay | Admitting: Neurology

## 2018-07-17 ENCOUNTER — Other Ambulatory Visit: Payer: Self-pay | Admitting: Neurology

## 2018-09-09 NOTE — Progress Notes (Signed)
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  UPDATE: She stopped topiramate due to developing kidney stones, weaned off over 1 1/2 months (stopped a month ago).  She followed up with the oral surgeon at [facility] and was non-surgical.  MRI of TMJ from 05/08/18 showed small right mandible condyle, mild marrow edema and enhancement within both mandibular condyle and normal articular discs without degeneration.  She continues to have aural fullness and stabbing pain in both ears. Stabbing ear pain occurs with change in weather, so it is sporadic.  It triggers occipital headaches and treated with Advil or sumatriptan.  It lasts a couple of hours.  Headaches occur 4 to 5 days in past month. Rescue protocol:  First Advil, second sumatriptan Current NSAIDS:  Advil Current analgesics:  no Current triptans:  sumatriptan 100mg  Current anti-emetic:  no Current muscle relaxants:  no Current anti-anxiolytic:  no Current sleep aide:  no Current Antihypertensive medications:  none Current Antidepressant medications:  none Current Anticonvulsant medications:  none Current Vitamins/Herbal/Supplements:  no Current Antihistamines/Decongestants:  no Other therapy:  no  Depression: no; Anxiety: no  HISTORY:  In late January 2017, she began experiencing bilateral ear pain (may occur unilaterally in either ear).  The pain felt like a stabbing in her ear.  There was associated aural fullness.  She noted a non-rhythmic tapping sound/sensation in her head.  She noted difficulty hearing in noisy areas.  There was no associated visual disturbance, tinnitus, unilateral weakness or numbness or vertigo.  However, she reported that she is more clumsy when walking, such as clipping a filing cabinet when walking by.  She had jaw pain and was diagnosed with TMJ  dysfunction.  However, ear pain persists despite different mouth guards.  She was given several rounds of antibiotics for possible ear infections, which have not been helpful.  She also has been evaluated by ENT.  Audiometric testing was normal.  CT of neck from 01/09/16 was reportedly unremarkable (Pharynx and larynx normal, mastoid and paranasal sinuses normal and report does not mention findings consistent with Eagle syndrome).  She has longstanding history of migraines and this ear pain has been triggering her migraines.  Location varies but may be temples or top/back of head.  It is pounding.  It typically last 4 to 6 days and thus has headache most of the days of the month.  There is associated nausea, photophobia and phonophobia.  There is no associated vomiting, visual disturbance or unilateral numbness or weakness.  She was diagnosed with TMJ dysfunction and prescribed a mouthguard which has helped prevent migraines.  Past abortive therapy:  Maxalt (side effects), Excedrin Past preventative therapy:  topiramate 100mg  (kidney stones) Other past medication:  Flexeril  MRI of brain and IAC with and without contrast was unremarkable.  PAST MEDICAL HISTORY: Past Medical History:  Diagnosis Date  . 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 Hx gestational diabetes   . Hx MRSA infection   . Hx of viral meningitis   . Kidney stones     MEDICATIONS: Current Outpatient Medications on File Prior to Visit  Medication Sig Dispense Refill  . cyclobenzaprine (FLEXERIL) 10 MG tablet     . EPINEPHrine 0.3 mg/0.3 mL IJ SOAJ injection   prn as needed    .  ibuprofen (ADVIL,MOTRIN) 100 MG tablet Take 100 mg by mouth every 6 (six) hours as needed for fever.    . SUMAtriptan (IMITREX) 100 MG tablet TAKE 1 TABLET BY MOUTH AT EARLY ONSET OF HEADACHE MAY REPEAT ONCE AFTER 2 HOURS IF HEADACHE PERSISTS 10 tablet 2  . topiramate  (TOPAMAX) 100 MG tablet Take 1 tablet (100 mg total) by mouth at bedtime. 30 tablet 5   No current facility-administered medications on file prior to visit.     ALLERGIES: Allergies  Allergen Reactions  . Sulfa Antibiotics Rash    Other reaction(s): Other (See Comments) Other Reaction: RASH/SWELLING  . Sulfamethoxazole-Trimethoprim     Other reaction(s): Other (See Comments) Uncoded Allergy. Allergen: MUSHROOMS, Other Reaction: RASH/SWELLING  . Hydrocodone-Acetaminophen     Other reaction(s): Other (See Comments) Unknown  . Sulfamethoxazole     Other reaction(s): Other (See Comments) Unknown    FAMILY HISTORY: Family History  Problem Relation Age of Onset  . Hypertension Mother   . Heart block Father     SOCIAL HISTORY: Social History   Socioeconomic History  . Marital status: Married    Spouse name: Not on file  . Number of children: Not on file  . Years of education: Not on file  . Highest education level: Not on file  Occupational History  . Not on file  Social Needs  . Financial resource strain: Not on file  . Food insecurity:    Worry: Not on file    Inability: Not on file  . Transportation needs:    Medical: Not on file    Non-medical: Not on file  Tobacco Use  . Smoking status: Former Games developermoker  . Smokeless tobacco: Never Used  Substance and Sexual Activity  . Alcohol use: Yes    Comment: Rare  . Drug use: No  . Sexual activity: Yes    Birth control/protection: Surgical    Comment: HYST  Lifestyle  . Physical activity:    Days per week: Not on file    Minutes per session: Not on file  . Stress: Not on file  Relationships  . Social connections:    Talks on phone: Not on file    Gets together: Not on file    Attends religious service: Not on file    Active member of club or organization: Not on file    Attends meetings of clubs or organizations: Not on file    Relationship status: Not on file  . Intimate partner violence:    Fear of current or  ex partner: Not on file    Emotionally abused: Not on file    Physically abused: Not on file    Forced sexual activity: Not on file  Other Topics Concern  . Not on file  Social History Narrative  . Not on file    REVIEW OF SYSTEMS: Constitutional: No fevers, chills, or sweats, no generalized fatigue, change in appetite Eyes: No visual changes, double vision, eye pain Ear, nose and throat: as above Cardiovascular: No chest pain, palpitations Respiratory:  No shortness of breath at rest or with exertion, wheezes GastrointestinaI: No nausea, vomiting, diarrhea, abdominal pain, fecal incontinence Genitourinary:  No dysuria, urinary retention or frequency Musculoskeletal:  No neck pain, back pain Integumentary: No rash, pruritus, skin lesions Neurological: as above Psychiatric: No depression, insomnia, anxiety Endocrine: No palpitations, fatigue, diaphoresis, mood swings, change in appetite, change in weight, increased thirst Hematologic/Lymphatic:  No purpura, petechiae. Allergic/Immunologic: no itchy/runny eyes, nasal congestion, recent allergic reactions,  rashes  PHYSICAL EXAM: Blood pressure (!) 144/86, pulse 94, height 5\' 3"  (1.6 m), weight 139 lb (63 kg), SpO2 97 %. General: No acute distress.  Patient appears well-groomed.   Head:  Normocephalic/atraumatic Eyes:  Fundi examined but not visualized Neck: supple, no paraspinal tenderness, full range of motion Heart:  Regular rate and rhythm Lungs:  Clear to auscultation bilaterally Back: No paraspinal tenderness Neurological Exam: alert and oriented to person, place, and time. Attention span and concentration intact, recent and remote memory intact, fund of knowledge intact.  Speech fluent and not dysarthric, language intact.  CN II-XII intact. Bulk and tone normal, muscle strength 5/5 throughout.  Sensation to light touch, temperature and vibration intact.  Deep tendon reflexes 2+ throughout.  Finger to nose and heel to shin  testing intact.  Gait normal, Romberg negative.  IMPRESSION: Atypical ear pain Migraine  Without aura, without status migrainosus, not intractable  PLAN: 1.  For preventative management, start nortriptyline 10mg  at bedtime.  We can increase dose to 20-25mg  at bedtime in 6 weeks if needed. 2.  For abortive therapy, Advil and sumatriptan 3.  Limit use of pain relievers to no more than 2 days out of week to prevent risk of rebound or medication-overuse headache. 4.  Keep headache diary 5.  Exercise, hydration, caffeine cessation, sleep hygiene, monitor for and avoid triggers 6.  Consider:  magnesium citrate 400mg  daily, riboflavin 400mg  daily, and coenzyme Q10 100mg  three times daily 7.  Follow up in 4 months.   Shon MilletAdam Semaya Vida, DO

## 2018-09-11 ENCOUNTER — Encounter: Payer: Self-pay | Admitting: Neurology

## 2018-09-11 ENCOUNTER — Ambulatory Visit (INDEPENDENT_AMBULATORY_CARE_PROVIDER_SITE_OTHER): Payer: BLUE CROSS/BLUE SHIELD | Admitting: Neurology

## 2018-09-11 VITALS — BP 144/86 | HR 94 | Ht 63.0 in | Wt 139.0 lb

## 2018-09-11 DIAGNOSIS — H9203 Otalgia, bilateral: Secondary | ICD-10-CM | POA: Diagnosis not present

## 2018-09-11 DIAGNOSIS — G43009 Migraine without aura, not intractable, without status migrainosus: Secondary | ICD-10-CM | POA: Diagnosis not present

## 2018-09-11 MED ORDER — NORTRIPTYLINE HCL 10 MG PO CAPS: 10.0000 mg | ORAL_CAPSULE | Freq: Every day | ORAL | 3 refills | Status: DC

## 2018-09-11 NOTE — Patient Instructions (Signed)
1.  Start nortriptyline 10mg  at bedtime.  If headache/ear pain not significantly improved in 6 weeks, contact me and we can increase dose. 2.  For abortive therapy, ibuprofen and sumatriptan 3.  Limit use of pain relievers to no more than 2 days out of week to prevent risk of rebound or medication-overuse headache. 4.  Keep headache diary 5.  Exercise, hydration, caffeine cessation, sleep hygiene, monitor for and avoid triggers 6.  Follow up in 4 months.

## 2018-09-30 ENCOUNTER — Other Ambulatory Visit: Payer: Self-pay

## 2018-09-30 MED ORDER — NORTRIPTYLINE HCL 25 MG PO CAPS: 25.0000 mg | ORAL_CAPSULE | Freq: Every day | ORAL | 3 refills | Status: DC

## 2018-10-03 ENCOUNTER — Other Ambulatory Visit: Payer: Self-pay | Admitting: Neurology

## 2018-10-13 ENCOUNTER — Other Ambulatory Visit: Payer: Self-pay | Admitting: Neurology

## 2018-10-20 ENCOUNTER — Other Ambulatory Visit: Payer: Self-pay | Admitting: Neurology

## 2018-10-25 ENCOUNTER — Other Ambulatory Visit: Payer: Self-pay | Admitting: Neurology

## 2018-11-27 ENCOUNTER — Other Ambulatory Visit: Payer: Self-pay

## 2018-11-27 MED ORDER — PROPRANOLOL HCL ER 60 MG PO CP24: 60.0000 mg | ORAL_CAPSULE | Freq: Every day | ORAL | 2 refills | Status: DC

## 2018-12-07 ENCOUNTER — Other Ambulatory Visit: Payer: Self-pay

## 2018-12-07 MED ORDER — ERENUMAB-AOOE 70 MG/ML ~~LOC~~ SOAJ: 70.0000 mg | SUBCUTANEOUS | 11 refills | Status: DC

## 2019-01-15 ENCOUNTER — Encounter: Payer: Self-pay | Admitting: Neurology

## 2019-01-17 NOTE — Progress Notes (Signed)
Virtual Visit via Video Note The purpose of this virtual visit is to provide medical care while limiting exposure to the novel coronavirus.    Consent was obtained for video visit:  Yes Answered questions that patient had about telehealth interaction:  Yes I discussed the limitations, risks, security and privacy concerns of performing an evaluation and management service by telemedicine. I also discussed with the patient that there may be a patient responsible charge related to this service. The patient expressed understanding and agreed to proceed.  Pt location: Home Physician Location: Home Name of referring provider:  P[facility], [facility]* I connected with Quintana Canelo at patients initiation/request on 01/18/2019 at  2:50 PM EDT by video enabled telemedicine application and verified that I am speaking with the correct person using two identifiers. 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  UPDATE: No migraines Rescue protocol:  First Advil, second sumatriptan Current NSAIDS: Advil Current analgesics: no Current triptans: sumatriptan 100mg  Current anti-emetic: no Current muscle relaxants:no Current anti-anxiolytic: hydroxyzine Current sleep aide: no Current Antihypertensive medications: none Current Antidepressant medications: none Current Anticonvulsant medications: none Current anti-CGRP:  Aimovig 70mg  Current Vitamins/Herbal/Supplements: no Current Antihistamines/Decongestants: no Other therapy: no  She continues to have aural fullness and popping.  She is grinding her teeth and has difficulty sleeping.  Therefore, she wakes up with posterior dull aching headaches radiating into the neck and anxiety.  HISTORY:  In late January 2017, she began experiencing bilateral  ear pain (may occur unilaterally in either ear). The pain felt like a stabbing in her ear. Stabbing ear pain occurs with change in weather, so it is sporadic. There was associated aural fullness. She noted a non-rhythmic tapping sound/sensation in her head. She noted difficulty hearing in noisy areas. There was no associated visual disturbance, tinnitus, unilateral weakness or numbness or vertigo. However, she reported that she is more clumsy when walking, such as clipping a filing cabinet when walking by. She had jaw pain and was diagnosed with TMJ dysfunction. However, ear pain persists despite different mouth guards. She was given several rounds of antibiotics for possible ear infections, which have not been helpful. She also has been evaluated by ENT. Audiometric testing was normal. CT of neck from 01/09/16 was reportedly unremarkable (Pharynx and larynx normal, mastoid and paranasal sinuses normal and report does not mention findings consistent with Eagle syndrome).  MRI TMJ with and without contrast from 05/08/18 showed mild marrow edema and enhancement within the mandibular condyles bilaterally.  The articular discs are normal without degeneration.  The articular discs and annular condyles translate normally.  She was told by the periodontist that there wasn't anything he can do.  She has longstanding history of migraines and this ear pain has been triggering her migraines.  Location varies but may be temples or top/back of head.  It is pounding.  It typically last 4 to 6 days and thus has headache most of the days of the month.  There is associated nausea, photophobia and phonophobia.  There is no associated vomiting, visual disturbance or unilateral numbness or weakness.    Past abortive therapy: Maxalt (side effects), Excedrin Past preventative therapy: topiramate 100mg  (kidney stones), nortriptyline (caused insomnia) Other past medication: Flexeril  MRI of brain and IAC with and  without contrast was unremarkable.  Past Medical History:  Past Medical History:  Diagnosis Date  . Grinding of teeth   . H/O viral infection    mirrored a virus from [place] that caused facial paralysis and face tw[place]itching [place] [facility]  . Hx gestational diabetes   . Hx MRSA infection   . Hx of viral meningitis   . Kidney stones   . Migraine     Medications: Outpatient Encounter Medications as of 01/18/2019  Medication Sig Note  . EPINEPHrine 0.3 mg/0.3 mL IJ SOAJ injection   prn as needed 07[facility]03[facility]2016[facility]  . Erenumab-aooe (AIMOVIG) 70 MG/ML SOAJ Inject 70 mg into the skin every 30 (thirty) days.   Marland Kitchen. ibuprofen (ADVIL,MOTRIN) 100 MG tablet Take 100 mg by mouth every 6 (six) hours as needed for fever.   . SUMAtriptan (IMITREX) 100 MG tablet TAKE 1 TABLET BY MOUTH AT EARLY ONSET OF HEADACHE MAY REPEAT ONCE AFTER 2 HOURS IF HEADACHE PERSISTS   . [DISCONTINUED] nortriptyline (PAMELOR) 10 MG capsule Take 1 capsule (10 mg total) by mouth at bedtime.   . [DISCONTINUED] nortriptyline (PAMELOR) 25 MG capsule TAKE 1 CAPSULE BY MOUTH AT BEDTIME.   . [DISCONTINUED] propranolol ER (INDERAL LA) 60 MG 24 hr capsule Take 1 capsule (60 mg total) by mouth at bedtime.    No facility-administered encounter medications on file as of 01/18/2019.     Allergies: Allergies  Allergen Reactions  . Sulfa Antibiotics Rash    Other reaction(s): Other (See Comments) Other Reaction: RASH/SWELLING  . Sulfamethoxazole-Trimethoprim     Other reaction(s): Other (See Comments) Uncoded Allergy. Allergen: MUSHROOMS, Other Reaction: RASH/SWELLING  . Hydrocodone-Acetaminophen     Other reaction(s): Other (See Comments) Unknown  . Sulfamethoxazole     Other reaction(s): Other (See Comments) Unknown    Family History: Family History  Problem Relation Age of Onset  . Hypertension Mother   . Heart block Father     Social History: Social History   Socioeconomic History   . Marital status: Married    Spouse name: Not on file  . Number of children: Not on file  . Years of education: Not on file  . Highest education level: Not on file  Occupational History  . Not on file  Social Needs  . Financial resource strain: Not on file  . Food insecurity    Worry: Not on file    Inability: Not on file  . Transportation needs    Medical: Not on file    Non-medical: Not on file  Tobacco Use  . Smoking status: Former Games developermoker  . Smokeless tobacco: Never Used  Substance and Sexual Activity  . Alcohol use: Yes    Comment: Rare  . Drug use: No  . Sexual activity: Yes    Birth control/protection: Surgical    Comment: HYST  Lifestyle  . Physical activity    Days per week: Not on file    Minutes per session: Not on file  . Stress: Not on file  Relationships  . Social Musicianconnections    Talks on phone: Not on file    Gets together: Not on file    Attends religious service: Not on file    Active member of club or organization: Not on file    Attends meetings of clubs or organizations: Not on file    Relationship status: Not on file  . Intimate partner violence    Fear of current or ex partner: Not on file    Emotionally abused: Not on  file    Physically abused: Not on file    Forced sexual activity: Not on file  Other Topics Concern  . Not on file  Social History Narrative   Right handed   Lives in a single story home with husband and 2 sons   Works for a lawyer   Observations/Objective:   Vitals:   01/15/19 1429  Weight: 140 lb (63.5 kg)  Height: 5\' 3"  (1.6 m)  no acute distress.  Alert and oriented.  Speech fluent and not dysarthric.  Language intact.  Face symmetric.    Assessment and Plan:   1.  Migraine without aura, without status migrainosus, not intractable 2.  Aural fullness.  Related to TMJ but nothing surgical can be done.  With daily morning headaches  1. To help reduce morning posterior headaches, will start tizanidine 2mg  to 4mg  at  bedtime.  If ineffective, will try gabapentin 100mg  at bedtime and increase dose from there.  2. For preventative management, Aimovig 3.  For abortive therapy, Advil first line, sumatriptan second line 4.  Limit use of pain relievers to no more than 2 days out of week to prevent risk of rebound or medication-overuse headache. 5.  Keep headache diary 6.  Exercise, hydration, caffeine cessation, sleep hygiene, monitor for and avoid triggers 7.  Consider:  magnesium citrate 400mg  daily, riboflavin 400mg  daily, and coenzyme Q10 100mg  three times daily 8. Always keep in mind that currently taking a hormone or birth control may be a possible trigger or aggravating factor for migraine. 9. Follow up 4 months  Follow Up Instructions:    -I discussed the assessment and treatment plan with the patient. The patient was provided an opportunity to ask questions and all were answered. The patient agreed with the plan and demonstrated an understanding of the instructions.   The patient was advised to call back or seek an in-person evaluation if the symptoms worsen or if the condition fails to improve as anticipated.   Cira ServantAdam Robert , DO

## 2019-01-18 ENCOUNTER — Other Ambulatory Visit: Payer: Self-pay

## 2019-01-18 ENCOUNTER — Telehealth (INDEPENDENT_AMBULATORY_CARE_PROVIDER_SITE_OTHER): Payer: BC Managed Care – PPO | Admitting: Neurology

## 2019-01-18 ENCOUNTER — Encounter: Payer: Self-pay | Admitting: Neurology

## 2019-01-18 VITALS — Ht 63.0 in | Wt 140.0 lb

## 2019-01-18 DIAGNOSIS — G43009 Migraine without aura, not intractable, without status migrainosus: Secondary | ICD-10-CM | POA: Diagnosis not present

## 2019-01-18 DIAGNOSIS — H938X9 Other specified disorders of ear, unspecified ear: Secondary | ICD-10-CM

## 2019-01-18 MED ORDER — TIZANIDINE HCL 2 MG PO TABS: ORAL_TABLET | ORAL | 3 refills | Status: DC

## 2019-01-19 ENCOUNTER — Ambulatory Visit: Payer: BLUE CROSS/BLUE SHIELD | Admitting: Neurology

## 2019-02-02 ENCOUNTER — Other Ambulatory Visit: Payer: Self-pay | Admitting: Neurology

## 2019-02-18 ENCOUNTER — Other Ambulatory Visit: Payer: Self-pay | Admitting: Neurology

## 2019-04-11 ENCOUNTER — Other Ambulatory Visit: Payer: Self-pay | Admitting: Neurology

## 2019-05-18 NOTE — Progress Notes (Signed)
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  UPDATE: In last 6 months, 1 migraine.   Still with jaw pain that causes her to wake up with posterior tension headaches 2-3 times a week .  Tizanidine helped for a little while but has lost efficacy. Rescue protocol: First Advil, second sumatriptan Current NSAIDS: Advil Current analgesics: no Current triptans: sumatriptan 100mg  Current anti-emetic: no Current muscle relaxants:tizanidine 2mg -4mg  at bedtime Current anti-anxiolytic: hydroxyzine Current sleep aide: no Current Antihypertensive medications: none Current Antidepressant medications: none Current Anticonvulsant medications:none Current anti-CGRP:  Aimovig 70mg  Current Vitamins/Herbal/Supplements: no Current Antihistamines/Decongestants: no Other therapy: no  She continues to have aural fullness and popping.  She is grinding her teeth and has difficulty sleeping.  Therefore, she wakes up with posterior dull aching headaches radiating into the neck and anxiety.  HISTORY: In late January 2017, she began experiencing bilateral ear pain(may occur unilaterally in either ear). The pain felt like a stabbing in her ear. Stabbing ear pain occurs with change in weather, so it is sporadic. There was associated aural fullness. She noted a non-rhythmic tapping sound/sensation in her head. She noted difficulty hearing in noisy areas. There was no associated visual disturbance, tinnitus, unilateral weakness or numbness or vertigo. However, she reported that she is more clumsy when walking, such as clipping a filing cabinet when walking by. She had jaw pain and was diagnosed with TMJ dysfunction. However, ear pain persists despite different mouth guards. She was given several rounds of antibiotics for possible  ear infections, which have not been helpful. She also has been evaluated by ENT. Audiometric testing was normal. CT of neck from 01/09/16 was reportedly unremarkable (Pharynx and larynx normal, mastoid and paranasal sinuses normal and report does not mention findings consistent with Eagle syndrome).  MRI TMJ with and without contrast from 05/08/18 showed mild marrow edema and enhancement within the mandibular condyles bilaterally.  The articular discs are normal without degeneration.  The articular discs and annular condyles translate normally.  She was told by the periodontist that there wasn't anything he can do.  She has longstanding history of migraines and this ear pain has been triggering her migraines. Location varies but may be temples or top/back of head. It is pounding. It typically last 4 to 6 days and thus has headache most of the days of the month. There is associated nausea, photophobia and phonophobia. There is no associated vomiting, visual disturbance or unilateral numbness or weakness. No specific triggers.  Past abortive therapy: Maxalt (side effects), Excedrin Past preventative therapy:topiramate 100mg  (kidney stones), nortriptyline (caused insomnia) Other past medication: Flexeril  MRI of brain and IAC with and without contrast was unremarkable.  PAST MEDICAL HISTORY: Past Medical History:  Diagnosis Date   Grinding of teeth    H/O viral infection    mirrored a virus from [place] that caused facial paralysis and face twtitching went to [facility]   Hx gestational diabetes    Hx MRSA infection    Hx of viral meningitis    Kidney stones    Migraine     MEDICATIONS: Current Outpatient Medications on File Prior to Visit  Medication Sig Dispense Refill   EPINEPHrine 0.3 mg/0.3 mL IJ SOAJ injection   prn as needed     Erenumab-aooe (AIMOVIG) 70 MG/ML SOAJ Inject 70 mg into the skin every 30 (thirty) days. 1 pen 11  hydrOXYzine (ATARAX/VISTARIL) 25 MG  tablet TAKE 1 2 TABLETS (25 50 MG) BY MOUTH AT BED TIME AS NEEDED FOR SLEEP     ibuprofen (ADVIL,MOTRIN) 100 MG tablet Take 100 mg by mouth every 6 (six) hours as needed for fever.     propranolol ER (INDERAL LA) 60 MG 24 hr capsule TAKE 1 CAPSULE (60 MG TOTAL) BY MOUTH AT BEDTIME. 90 capsule 0   SUMAtriptan (IMITREX) 100 MG tablet TAKE 1 TABLET BY MOUTH AT EARLY ONSET OF HEADACHE MAY REPEAT ONCE AFTER 2 HOURS IF HEADACHE PERSISTS 10 tablet 2   tiZANidine (ZANAFLEX) 2 MG tablet TAKE 1 TO 2 TABLETS BY MOUTH EVERY DAY AT BEDTIME 180 tablet 1   No current facility-administered medications on file prior to visit.     ALLERGIES: Allergies  Allergen Reactions   Sulfa Antibiotics Rash    Other reaction(s): Other (See Comments) Other Reaction: RASH/SWELLING   Sulfamethoxazole-Trimethoprim     Other reaction(s): Other (See Comments) Uncoded Allergy. Allergen: MUSHROOMS, Other Reaction: RASH/SWELLING   Hydrocodone-Acetaminophen     Other reaction(s): Other (See Comments) Unknown   Sulfamethoxazole     Other reaction(s): Other (See Comments) Unknown    FAMILY HISTORY: Family History  Problem Relation Age of Onset   Hypertension Mother    Heart block Father    SOCIAL HISTORY: Social History   Socioeconomic History   Marital status: Married    Spouse name: Not on file   Number of children: Not on file   Years of education: Not on file   Highest education level: Not on file  Occupational History   Not on file  Social Needs   Financial resource strain: Not on file   Food insecurity    Worry: Not on file    Inability: Not on file   Transportation needs    Medical: Not on file    Non-medical: Not on file  Tobacco Use   Smoking status: Former Smoker   Smokeless tobacco: Never Used  Substance and Sexual Activity   Alcohol use: Yes    Comment: Rare   Drug use: No   Sexual activity: Yes    Birth control/protection: Surgical    Comment: HYST  Lifestyle     Physical activity    Days per week: Not on file    Minutes per session: Not on file   Stress: Not on file  Relationships   Social connections    Talks on phone: Not on file    Gets together: Not on file    Attends religious service: Not on file    Active member of club or organization: Not on file    Attends meetings of clubs or organizations: Not on file    Relationship status: Not on file   Intimate partner violence    Fear of current or ex partner: Not on file    Emotionally abused: Not on file    Physically abused: Not on file    Forced sexual activity: Not on file  Other Topics Concern   Not on file  Social History Narrative   Right handed   Lives in a single story home with husband and 2 sons   Works for a Clinical research associatelawyer    REVIEW OF SYSTEMS: Constitutional: No fevers, chills, or sweats, no generalized fatigue, change in appetite Eyes: No visual changes, double vision, eye pain Ear, nose and throat: No hearing loss, ear pain, nasal congestion, sore throat Cardiovascular: No chest pain, palpitations Respiratory:  No shortness  of breath at rest or with exertion, wheezes GastrointestinaI: No nausea, vomiting, diarrhea, abdominal pain, fecal incontinence Genitourinary:  No dysuria, urinary retention or frequency Musculoskeletal:  No neck pain, back pain Integumentary: No rash, pruritus, skin lesions Neurological: as above Psychiatric: No depression, insomnia, anxiety Endocrine: No palpitations, fatigue, diaphoresis, mood swings, change in appetite, change in weight, increased thirst Hematologic/Lymphatic:  No purpura, petechiae. Allergic/Immunologic: no itchy/runny eyes, nasal congestion, recent allergic reactions, rashes  PHYSICAL EXAM: Blood pressure (!) 132/92, pulse 94, temperature 98 F (36.7 C), height 5\' 3"  (1.6 m), weight 151 lb (68.5 kg), SpO2 98 %. General: No acute distress.  Patient appears well-groomed.   Head:  Normocephalic/atraumatic Eyes:  Fundi  examined but not visualized Neck: supple, no paraspinal tenderness, full range of motion Heart:  Regular rate and rhythm Lungs:  Clear to auscultation bilaterally Back: No paraspinal tenderness Neurological Exam: alert and oriented to person, place, and time. Attention span and concentration intact, recent and remote memory intact, fund of knowledge intact.  Speech fluent and not dysarthric, language intact.  CN II-XII intact. Bulk and tone normal, muscle strength 5/5 throughout.  Sensation to light touch  intact.  Deep tendon reflexes 2+ throughout.  Finger to nose testing intact.  Gait normal, Romberg negative.  IMPRESSION: 1.  Migraine without aura, without status migrainosus, not intractable 2.  TMJ dysfunction.  PLAN: 1.  For preventative management, Aimovig 2. Stop tizanidine.  For jaw pain, take baclofen 10mg  at bedtime 3.  For abortive therapy:  First line - Advil, second line- sumatriptan 4.  Limit use of pain relievers to no more than 2 days out of week to prevent risk of rebound or medication-overuse headache. 5.  Keep headache diary 6.  Exercise, hydration, caffeine cessation, sleep hygiene, monitor for and avoid triggers 7.  Consider:  magnesium citrate 400mg  daily, riboflavin 400mg  daily, and coenzyme Q10 100mg  three times daily 8. Follow up 6 months   , DO

## 2019-05-21 ENCOUNTER — Encounter: Payer: Self-pay | Admitting: Neurology

## 2019-05-21 ENCOUNTER — Ambulatory Visit (INDEPENDENT_AMBULATORY_CARE_PROVIDER_SITE_OTHER): Payer: BC Managed Care – PPO | Admitting: Neurology

## 2019-05-21 ENCOUNTER — Other Ambulatory Visit: Payer: Self-pay

## 2019-05-21 VITALS — BP 132/92 | HR 94 | Temp 98.0°F | Ht 63.0 in | Wt 151.0 lb

## 2019-05-21 DIAGNOSIS — M26609 Unspecified temporomandibular joint disorder, unspecified side: Secondary | ICD-10-CM | POA: Diagnosis not present

## 2019-05-21 DIAGNOSIS — G43009 Migraine without aura, not intractable, without status migrainosus: Secondary | ICD-10-CM | POA: Diagnosis not present

## 2019-05-21 MED ORDER — BACLOFEN 10 MG PO TABS: 10.0000 mg | ORAL_TABLET | Freq: Every day | ORAL | 5 refills | Status: DC

## 2019-05-21 NOTE — Patient Instructions (Signed)
1.  Continue Aimovig every 30 days 2.  Stop tizanidine.  Instead, take baclofen 10mg  at bedtime 3.  Advil and sumatriptan for acute headaches 4.  Limit use of pain relievers to no more than 2 days out of week to prevent risk of rebound or medication-overuse headache. 5.  Keep headache diary 6.  Follow up in 6 months.

## 2019-09-27 ENCOUNTER — Other Ambulatory Visit: Payer: Self-pay | Admitting: Neurology

## 2019-11-17 NOTE — Progress Notes (Signed)
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  UPDATE: Stopped Aimovig last month because the copay card program ran out last month and the cost is $600. She has had one last Friday.  Otherwise, only 2 in past year.  She did not tolerate baclofen, so she is back on tizanidine.   Rescue protocol: First Advil, second sumatriptan Current NSAIDS: Advil Current analgesics: no Current triptans: sumatriptan 100mg  Current anti-emetic: no Current muscle relaxants:tizanidine 2mg -4mg  at bedtime Current anti-anxiolytic: hydroxyzine Current sleep aide: no Current Antihypertensive medications: propranolol ER 60mg  Current Antidepressant medications: none Current Anticonvulsant medications:none Current anti-CGRP: Aimovig 70mg  Current Vitamins/Herbal/Supplements: no Current Antihistamines/Decongestants: no Other therapy: no  She continues to have aural fullness and popping. She is grinding her teeth and has difficulty sleeping. Therefore, she wakes up with posterior dull aching headaches radiating into the neck and anxiety.  HISTORY: In late January 2017, she began experiencing bilateral ear pain(may occur unilaterally in either ear). The pain felt like a stabbing in her ear. Stabbing ear pain occurs with change in weather, so it is sporadic. There was associated aural fullness. She noted a non-rhythmic tapping sound/sensation in her head. She noted difficulty hearing in noisy areas. There was no associated visual disturbance, tinnitus, unilateral weakness or numbness or vertigo. However, she reported that she is more clumsy when walking, such as clipping a filing cabinet when walking by. She had jaw pain and was diagnosed with TMJ dysfunction. However, ear pain persists despite different mouth guards. She  was given several rounds of antibiotics for possible ear infections, which have not been helpful. She also has been evaluated by ENT. Audiometric testing was normal. CT of neck from 01/09/16 was reportedly unremarkable (Pharynx and larynx normal, mastoid and paranasal sinuses normal and report does not mention findings consistent with Eagle syndrome).MRI TMJ with and without contrast from 05/08/18 showed mild marrow edema and enhancement within the mandibular condyles bilaterally. The articular discs are normal without degeneration. The articular discs and annular condyles translate normally. She was told by the periodontist that there wasn't anything he can do.  She has longstanding history of migraines and this ear pain has been triggering her migraines. Location varies but may be temples or top/back of head. It is pounding. It typically last 4 to 6 days and thus has headache most of the days of the month. There is associated nausea, photophobia and phonophobia. There is no associated vomiting, visual disturbance or unilateral numbness or weakness. No specific triggers.  Past abortive therapy: Maxalt (side effects), Excedrin Past muscle relaxants:  Baclofen (side effects); Flexeril Past preventative therapy:topiramate 100mg  (kidney stones), nortriptyline (caused insomnia)  MRI of brain and IAC with and without contrast was unremarkable.  PAST MEDICAL HISTORY: Past Medical History:  Diagnosis Date  . Grinding of teeth   . 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 Hx gestational diabetes   . Hx MRSA infection   . Hx of viral meningitis   . Kidney stones   . Migraine   . Tennis elbow    R elbow    MEDICATIONS: Current Outpatient Medications on File Prior to Visit  Medication Sig Dispense Refill  . baclofen (LIORESAL) 10 MG tablet Take 1 tablet (10 mg total) by mouth at bedtime. 30 each 5  .  EPINEPHrine 0.3 mg/0.3 mL IJ SOAJ injection   prn as needed    . Erenumab-aooe (AIMOVIG) 70 MG/ML SOAJ Inject 70 mg into the skin every 30 (thirty) days. 1 pen 11  . hydrOXYzine (ATARAX/VISTARIL) 25 MG tablet TAKE 1 2 TABLETS (25 50 MG) BY MOUTH AT BED TIME AS NEEDED FOR SLEEP    . ibuprofen (ADVIL,MOTRIN) 100 MG tablet Take 100 mg by mouth every 6 (six) hours as needed for fever.    . propranolol ER (INDERAL LA) 60 MG 24 hr capsule TAKE 1 CAPSULE (60 MG TOTAL) BY MOUTH AT BEDTIME. (Patient not taking: Reported on 05/21/2019) 90 capsule 0  . SUMAtriptan (IMITREX) 100 MG tablet TAKE 1 TABLET BY MOUTH AT EARLY ONSET OF HEADACHE MAY REPEAT ONCE AFTER 2 HOURS IF HEADACHE PERSISTS 10 tablet 2   No current facility-administered medications on file prior to visit.    ALLERGIES: Allergies  Allergen Reactions  . Sulfa Antibiotics Rash    Other reaction(s): Other (See Comments) Other Reaction: RASH/SWELLING  . Sulfamethoxazole-Trimethoprim     Other reaction(s): Other (See Comments) Uncoded Allergy. Allergen: MUSHROOMS, Other Reaction: RASH/SWELLING  . Hydrocodone-Acetaminophen     Other reaction(s): Other (See Comments) Unknown  . Sulfamethoxazole     Other reaction(s): Other (See Comments) Unknown    FAMILY HISTORY: Family History  Problem Relation Age of Onset  . Hypertension Mother   . Heart block Father     SOCIAL HISTORY: Social History   Socioeconomic History  . Marital status: Married    Spouse name: Not on file  . Number of children: Not on file  . Years of education: Not on file  . Highest education level: Not on file  Occupational History  . Not on file  Tobacco Use  . Smoking status: Former Games developer  . Smokeless tobacco: Never Used  Substance and Sexual Activity  . Alcohol use: Yes    Comment: Rare  . Drug use: No  . Sexual activity: Yes    Birth control/protection: Surgical    Comment: HYST  Other Topics Concern  . Not on file  Social History Narrative    Right handed   Lives in a single story home with husband and 2 sons   Works for a Clinical research associate   Caffeine - one soda in am    Social Determinants of Health   Financial Resource Strain:   . Difficulty of Paying Living Expenses:   Food Insecurity:   . Worried About Programme researcher, broadcasting/film/video in the Last Year:   . Barista in the Last Year:   Transportation Needs:   . Freight forwarder (Medical):   Marland Kitchen Lack of Transportation (Non-Medical):   Physical Activity:   . Days of Exercise per Week:   . Minutes of Exercise per Session:   Stress:   . Feeling of Stress :   Social Connections:   . Frequency of Communication with Friends and Family:   . Frequency of Social Gatherings with Friends and Family:   . Attends Religious Services:   . Active Member of Clubs or Organizations:   . Attends Banker Meetings:   Marland Kitchen Marital Status:   Intimate Partner Violence:   . Fear of Current or Ex-Partner:   . Emotionally Abused:   Marland Kitchen Physically Abused:   . Sexually Abused:     PHYSICAL EXAM: Blood pressure (!) 142/90, pulse 83, height 5\' 3"  (1.6 m), weight 153 lb 6.4 oz (69.6 kg), SpO2 97 %.  General: No acute distress.  Patient appears well-groomed.   Head:  Normocephalic/atraumatic Eyes:  Fundi examined but not visualized Neck: supple, no paraspinal tenderness, full range of motion Heart:  Regular rate and rhythm Lungs:  Clear to auscultation bilaterally Back: No paraspinal tenderness Neurological Exam: alert and oriented to person, place, and time. Attention span and concentration intact, recent and remote memory intact, fund of knowledge intact.  Speech fluent and not dysarthric, language intact.  CN II-XII intact. Bulk and tone normal, muscle strength 5/5 throughout.  Sensation to light touch, temperature and vibration intact.  Deep tendon reflexes 2+ throughout, toes downgoing.  Finger to nose and heel to shin testing intact.  Gait normal, Romberg negative.  IMPRESSION: 1.  Migraine  without aura, without status migrainosus, not intractable 2.  TMJ dysfunction  PLAN: 1.  For preventative management, Aimovig 70mg  monthly.  Provided her with a new copay card which should work for another year.  Sample given today 2.  For migraine abortive therapy, Advil first line, sumatriptan second line 3.  For jaw pain, tizanidine at bedtime 4.  Limit use of pain relievers to no more than 2 days out of week to prevent risk of rebound or medication-overuse headache. 5.  Keep headache diary 6.  Exercise, hydration, caffeine cessation, sleep hygiene, monitor for and avoid triggers 7. Follow up 9 months.   Metta Clines, DO

## 2019-11-19 ENCOUNTER — Other Ambulatory Visit: Payer: Self-pay

## 2019-11-19 ENCOUNTER — Encounter: Payer: Self-pay | Admitting: Neurology

## 2019-11-19 ENCOUNTER — Ambulatory Visit (INDEPENDENT_AMBULATORY_CARE_PROVIDER_SITE_OTHER): Payer: BC Managed Care – PPO | Admitting: Neurology

## 2019-11-19 VITALS — BP 142/90 | HR 83 | Ht 63.0 in | Wt 153.4 lb

## 2019-11-19 DIAGNOSIS — M26609 Unspecified temporomandibular joint disorder, unspecified side: Secondary | ICD-10-CM

## 2019-11-19 DIAGNOSIS — G43009 Migraine without aura, not intractable, without status migrainosus: Secondary | ICD-10-CM

## 2019-11-19 MED ORDER — AIMOVIG 70 MG/ML ~~LOC~~ SOAJ: 70.0000 mg | SUBCUTANEOUS | 11 refills | Status: DC

## 2019-11-19 NOTE — Patient Instructions (Signed)
1.  Refilled Aimovig.  Use new copay card 2.  Sumatriptan as needed. 3.  Tizanidine 4.  Follow up in 9 months

## 2020-01-03 ENCOUNTER — Other Ambulatory Visit: Payer: Self-pay | Admitting: Neurology

## 2020-02-01 ENCOUNTER — Other Ambulatory Visit: Payer: Self-pay | Admitting: Neurology

## 2020-05-23 ENCOUNTER — Other Ambulatory Visit: Payer: Self-pay | Admitting: Neurology

## 2020-06-06 ENCOUNTER — Other Ambulatory Visit: Payer: Self-pay | Admitting: Neurology

## 2020-08-07 NOTE — Progress Notes (Signed)
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  UPDATE: Over the past year, she reports around 6 migraine.  Aborts in a couple of hours.  Tizanidine helps with jaw pain.   Rescue protocol: First Advil, second sumatriptan Current NSAIDS: Advil Current analgesics: no Current triptans: sumatriptan 100mg  Current anti-emetic: no Current muscle relaxants:tizanidine 2mg -4mg  at bedtime Current anti-anxiolytic: hydroxyzine Current sleep aide: no Current Antihypertensive medications: none Current Antidepressant medications: none Current Anticonvulsant medications:none Current anti-CGRP: Aimovig 70mg  Current Vitamins/Herbal/Supplements: no Current Antihistamines/Decongestants: no Other therapy: no   HISTORY: In late January2017, she began experiencing bilateral ear pain(may occur unilaterally in either ear). The pain felt like a stabbing in her ear. Stabbing ear pain occurs with change in weather, so it is sporadic. There was associatedaural fullness. She noted a non-rhythmic tapping sound/sensation in her head. She noted difficulty hearing in noisy areas. There was no associated visual disturbance, tinnitus, unilateral weakness or numbness or vertigo. However, she reported that she is more clumsy when walking, such as clipping a filing cabinet when walking by. She had jaw pain and was diagnosed with TMJ dysfunction. However, ear pain persists despite different mouth guards. She was given several rounds of antibiotics for possible ear infections, which have not been helpful. She also has been evaluated by ENT. Audiometric testing was normal. CT of neck from 01/09/16 was reportedly unremarkable (Pharynx and larynx normal, mastoid and paranasal sinuses normal and report does not mention findings consistent with Eagle  syndrome).MRI TMJ with and without contrast from 05/08/18 showed mild marrow edema and enhancement within the mandibular condyles bilaterally. The articular discs are normal without degeneration. The articular discs and annular condyles translate normally. She was told by the periodontist that there wasn't anything he can do.  She has longstanding history of migraines and this ear pain has been triggering her migraines. Location varies but may be temples or top/back of head. It is pounding. It typically last 4 to 6 days and thus has headache most of the days of the month. There is associated nausea, photophobia and phonophobia. There is no associated vomiting, visual disturbance or unilateral numbness or weakness. No specific triggers.  Past abortive therapy: Maxalt (side effects), Excedrin Past muscle relaxants:  Baclofen (side effects); Flexeril Past preventative therapy:topiramate 100mg  (kidney stones), nortriptyline (caused insomnia), propranolol ER 60mg   MRI of brain and IAC with and without contrast was unremarkable.  PAST MEDICAL HISTORY: Past Medical History:  Diagnosis Date  . Grinding of teeth   . 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 Hx gestational diabetes   . Hx MRSA infection   . Hx of viral meningitis   . Kidney stones   . Migraine   . Tennis elbow    R elbow    MEDICATIONS: Current Outpatient Medications on File Prior to Visit  Medication Sig Dispense Refill  . baclofen (LIORESAL) 10 MG tablet Take 1 tablet (10 mg total) by mouth at bedtime. (Patient not taking: Reported on 11/19/2019) 30 each 5  . EPINEPHrine 0.3 mg/0.3 mL IJ SOAJ injection   prn as needed    . Erenumab-aooe (AIMOVIG) 70 MG/ML SOAJ Inject 70 mg into the skin every 30 (thirty) days. 1 pen 11  . hydrOXYzine (ATARAX/VISTARIL) 25 MG tablet TAKE 1 2 TABLETS (25 50 MG) BY  MOUTH AT BED TIME AS NEEDED FOR SLEEP     . ibuprofen (ADVIL,MOTRIN) 100 MG tablet Take 100 mg by mouth every 6 (six) hours as needed for fever.    . propranolol ER (INDERAL LA) 60 MG 24 hr capsule TAKE 1 CAPSULE (60 MG TOTAL) BY MOUTH AT BEDTIME. (Patient not taking: Reported on 11/19/2019) 90 capsule 0  . SUMAtriptan (IMITREX) 100 MG tablet TAKE 1 TABLET BY MOUTH AT EARLY ONSET OF HEADACHE MAY REPEAT ONCE AFTER 2 HOURS IF HEADACHE PERSISTS 10 tablet 2  . tiZANidine (ZANAFLEX) 2 MG tablet TAKE 1 TO 2 TABLETS BY MOUTH EVERY DAY AT BEDTIME 180 tablet 1   No current facility-administered medications on file prior to visit.    ALLERGIES: Allergies  Allergen Reactions  . Sulfa Antibiotics Rash    Other reaction(s): Other (See Comments) Other Reaction: RASH/SWELLING  . Sulfamethoxazole-Trimethoprim     Other reaction(s): Other (See Comments) Uncoded Allergy. Allergen: MUSHROOMS, Other Reaction: RASH/SWELLING  . Hydrocodone-Acetaminophen     Other reaction(s): Other (See Comments) Unknown  . Sulfamethoxazole     Other reaction(s): Other (See Comments) Unknown    FAMILY HISTORY: Family History  Problem Relation Age of Onset  . Hypertension Mother   . Heart block Father     SOCIAL HISTORY: Social History   Socioeconomic History  . Marital status: Married    Spouse name: Not on file  . Number of children: Not on file  . Years of education: Not on file  . Highest education level: Not on file  Occupational History  . Not on file  Tobacco Use  . Smoking status: Former Games developer  . Smokeless tobacco: Never Used  Substance and Sexual Activity  . Alcohol use: Yes    Comment: Rare  . Drug use: No  . Sexual activity: Yes    Birth control/protection: Surgical    Comment: HYST  Other Topics Concern  . Not on file  Social History Narrative   Right handed   Lives in a single story home with husband and 2 sons   Works for a Clinical research associate   Caffeine - one soda in am    Social Determinants of Health   Financial Resource Strain:  Not on file  Food Insecurity: Not on file  Transportation Needs: Not on file  Physical Activity: Not on file  Stress: Not on file  Social Connections: Not on file  Intimate Partner Violence: Not on file     Objective:  Blood pressure 127/88, pulse 95, height 5\' 3"  (1.6 m), weight 149 lb 12.8 oz (67.9 kg), SpO2 (!) 89 %. General: No acute distress.  Patient appears well-groomed.      Assessment/Plan:   1.  Migraine without aura, without status migrainosus, not intractable 2.  TMJ dysfunction  1.  Migraine prevention:  Aimovig 70mg  monthly 2.  Migraine rescue:  Advil first line, sumatriptan second line 3.  For jaw pain:  Tizanidine at bedtime 4.  Limit use of pain relievers to no more than 2 days out of week to prevent risk of rebound or medication-overuse headache. 5.  Keep headache diary 6.  Follow up one year  , DO

## 2020-08-09 ENCOUNTER — Other Ambulatory Visit: Payer: Self-pay

## 2020-08-09 ENCOUNTER — Encounter: Payer: Self-pay | Admitting: Neurology

## 2020-08-09 ENCOUNTER — Ambulatory Visit (INDEPENDENT_AMBULATORY_CARE_PROVIDER_SITE_OTHER): Payer: BC Managed Care – PPO | Admitting: Neurology

## 2020-08-09 VITALS — BP 127/88 | HR 95 | Ht 63.0 in | Wt 149.8 lb

## 2020-08-09 DIAGNOSIS — M26609 Unspecified temporomandibular joint disorder, unspecified side: Secondary | ICD-10-CM | POA: Diagnosis not present

## 2020-08-09 DIAGNOSIS — G43009 Migraine without aura, not intractable, without status migrainosus: Secondary | ICD-10-CM | POA: Diagnosis not present

## 2020-08-09 MED ORDER — AIMOVIG 70 MG/ML ~~LOC~~ SOAJ: 70.0000 mg | SUBCUTANEOUS | 11 refills | Status: DC

## 2020-08-09 MED ORDER — SUMATRIPTAN SUCCINATE 100 MG PO TABS: ORAL_TABLET | ORAL | 5 refills | Status: DC

## 2020-08-09 MED ORDER — TIZANIDINE HCL 2 MG PO TABS: ORAL_TABLET | ORAL | 1 refills | Status: DC

## 2020-08-09 NOTE — Patient Instructions (Signed)
Refilled Aimovig, sumatriptan and tizanidine

## 2020-08-25 ENCOUNTER — Ambulatory Visit: Payer: BC Managed Care – PPO | Admitting: Neurology

## 2020-08-29 ENCOUNTER — Telehealth: Payer: Self-pay

## 2020-08-29 ENCOUNTER — Other Ambulatory Visit: Payer: Self-pay | Admitting: Neurology

## 2020-08-29 MED ORDER — AJOVY 225 MG/1.5ML ~~LOC~~ SOAJ: 225.0000 mg | SUBCUTANEOUS | 5 refills | Status: DC

## 2020-08-29 NOTE — Telephone Encounter (Signed)
Telephone call to pt, discuss other options since Aimovig not a covered medication.   Per pt she is okay with switching to Ajovy.   Please send script to pharmacy on file.

## 2020-08-29 NOTE — Telephone Encounter (Signed)
Insurance no longer covering Aimovig.  Does not qualify for copay card.  Switch to Ajovy.  Prescription sent in.

## 2020-11-21 ENCOUNTER — Other Ambulatory Visit: Payer: Self-pay | Admitting: Neurology

## 2020-11-21 MED ORDER — EMGALITY 120 MG/ML ~~LOC~~ SOAJ: 240.0000 mg | Freq: Once | SUBCUTANEOUS | 0 refills | Status: AC

## 2020-11-21 MED ORDER — EMGALITY 120 MG/ML ~~LOC~~ SOAJ
120.0000 mg | SUBCUTANEOUS | 5 refills | Status: DC
Start: 2020-11-21 — End: 2020-12-20

## 2020-11-22 NOTE — Telephone Encounter (Signed)
Medication does not require PA per insurance- she has a paid claim as of 11/21/20 for the emgality- she picked medication up at the [facility]. She is good to go.

## 2020-12-04 ENCOUNTER — Other Ambulatory Visit: Payer: Self-pay | Admitting: Neurology

## 2020-12-19 ENCOUNTER — Other Ambulatory Visit: Payer: Self-pay | Admitting: Neurology

## 2020-12-20 ENCOUNTER — Other Ambulatory Visit: Payer: Self-pay

## 2020-12-20 MED ORDER — EMGALITY 120 MG/ML ~~LOC~~ SOAJ: 120.0000 mg | SUBCUTANEOUS | 5 refills | Status: DC

## 2021-02-14 ENCOUNTER — Other Ambulatory Visit: Payer: Self-pay | Admitting: Nurse Practitioner

## 2021-02-14 DIAGNOSIS — Z1231 Encounter for screening mammogram for malignant neoplasm of breast: Secondary | ICD-10-CM

## 2021-02-21 ENCOUNTER — Other Ambulatory Visit: Payer: Self-pay

## 2021-02-21 ENCOUNTER — Ambulatory Visit
Admission: RE | Admit: 2021-02-21 | Discharge: 2021-02-21 | Disposition: A | Discharge status: Home / Self Care | Payer: BC Managed Care – PPO | Source: Ambulatory Visit | Attending: Nurse Practitioner | Admitting: Nurse Practitioner

## 2021-02-21 DIAGNOSIS — Z1231 Encounter for screening mammogram for malignant neoplasm of breast: Secondary | ICD-10-CM

## 2021-03-19 ENCOUNTER — Telehealth: Payer: Self-pay

## 2021-03-19 NOTE — Telephone Encounter (Signed)
Jessica Gross KeyTammi Klippel - PA Case ID: [id number] Need help? Call us at [phone] Status Sent to [facility] Emgality 120MG /ML auto-injectors (migraine) Form [employer] Electronic PA Form [phone] NCPDP)

## 2021-03-27 NOTE — Telephone Encounter (Signed)
F/u   Approved on 03/19/2021   Key: [id number] - PA Case ID: [id number]  Your PA request has been approved. Additional information will be provided in the approval communication. (Message [phone])

## 2021-06-11 ENCOUNTER — Other Ambulatory Visit: Payer: Self-pay | Admitting: Neurology

## 2021-06-16 ENCOUNTER — Other Ambulatory Visit: Payer: Self-pay | Admitting: Neurology

## 2021-08-14 NOTE — Progress Notes (Signed)
NEUROLOGY FOLLOW UP OFFICE NOTE  Jessica Gross [id number]  Assessment/Plan:   1.  Migraine without aura, without status migrainosus, not intractable 2.  Vestibular migraine 3.  TMJ dysfunction   1.  Migraine prevention:  Emgality. Provided her samples and will reach out to the rep to find out about availability problems. 2.  Migraine rescue:  Advil first line, sumatriptan second line 3.  For jaw pain:  Tizanidine at bedtime 4.  Limit use of pain relievers to no more than 2 days out of week to prevent risk of rebound or medication-overuse headache. 5.  Keep headache diary 6.  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   UPDATE: Last year, her insurance no longer would cover Aimovig.  She was switched to [employer].  Effective.  They are moderate (rarely severe), lasts a couple of hours, and had about 2 in last 4 months. She has had some vertigo with migraines as well.  About 2 in past 6 months.  However she missed her last dose.  Her pharmacy told her that they are not able to get Emgality autoinjector (only pre-filled). She was supposed to take her next injection on Friday.   Rescue protocol:  First Advil, second sumatriptan Current NSAIDS:  Advil Current analgesics:  no Current triptans:  sumatriptan 100mg  Current anti-emetic:  no Current muscle relaxants:  tizanidine 2mg -4mg  at bedtime Current anti-anxiolytic:  hydroxyzine Current sleep aide:  no Current Antihypertensive medications:  none Current Antidepressant medications:  none Current Anticonvulsant medications:  none Current anti-CGRP:  Emgality Current Vitamins/Herbal/Supplements:  no Current Antihistamines/Decongestants:  no Other therapy:  no     HISTORY:  In late January 2017, she began experiencing bilateral ear pain (may occur unilaterally in either ear).  The pain felt like a stabbing in her ear.   Stabbing ear pain occurs with change in weather, so it is sporadic. There was associated aural fullness.  She noted a non-rhythmic tapping sound/sensation in her head.  She noted difficulty hearing in noisy areas.  There was no associated visual disturbance, tinnitus, unilateral weakness or numbness or vertigo.  However, she reported that she is more clumsy when walking, such as clipping a filing cabinet when walking by.  She had jaw pain and was diagnosed with TMJ dysfunction.  However, ear pain persists despite different mouth guards.  She was given several rounds of antibiotics for possible ear infections, which have not been helpful.  She also has been evaluated by ENT.  Audiometric testing was normal.  CT of neck from 01/09/16 was reportedly unremarkable (Pharynx and larynx normal, mastoid and paranasal sinuses normal and report does not mention findings consistent with Eagle syndrome).  MRI TMJ with and without contrast from 05/08/18 showed mild marrow edema and enhancement within the mandibular condyles bilaterally.  The articular discs are normal without degeneration.  The articular discs and annular condyles translate normally.  She was told by the periodontist that there wasn't anything he can do.   She has longstanding history of migraines and this ear pain has been triggering her migraines.  Location varies but may be temples or top/back of head.  It is pounding.  It typically last 4 to 6 days and thus has headache most of the days of the month.  There is associated nausea, photophobia and phonophobia.  There is no associated vomiting, visual disturbance or unilateral numbness or weakness.  No specific  triggers.   Past abortive therapy:  Maxalt (side effects), Excedrin Past muscle relaxants:  Baclofen (side effects); Flexeril Past preventative therapy:  topiramate 100mg  (kidney stones), nortriptyline (caused insomnia), propranolol ER 60mg , Aimovig 70mg  (effective but no longer covered by insurance)    MRI of brain and IAC with and without contrast was unremarkable.  PAST MEDICAL HISTORY: Past Medical History:  Diagnosis Date   Grinding of teeth    H/O viral infection    mirrored a virus from [place] that caused facial paralysis and face twtitching went to [facility]   Hx gestational diabetes    Hx MRSA infection    Hx of viral meningitis    Kidney stones    Migraine    Tennis elbow    R elbow    MEDICATIONS: Current Outpatient Medications on File Prior to Visit  Medication Sig Dispense Refill   EMGALITY 120 MG/ML SOAJ INJECT 120 MG INTO THE SKIN EVERY 28 (TWENTY-EIGHT) DAYS. 1 mL 2   EPINEPHrine 0.3 mg/0.3 mL IJ SOAJ injection   prn as needed     hydrOXYzine (ATARAX/VISTARIL) 25 MG tablet TAKE 1 2 TABLETS (25 50 MG) BY MOUTH AT BED TIME AS NEEDED FOR SLEEP (Patient not taking: Reported on 08/09/2020)     ibuprofen (ADVIL,MOTRIN) 100 MG tablet Take 100 mg by mouth every 6 (six) hours as needed for fever.     propranolol ER (INDERAL LA) 60 MG 24 hr capsule TAKE 1 CAPSULE (60 MG TOTAL) BY MOUTH AT BEDTIME. (Patient not taking: No sig reported) 90 capsule 0   SUMAtriptan (IMITREX) 100 MG tablet TAKE 1 TABLET BY MOUTH AT EARLY ONSET OF HEADACHE MAY REPEAT ONCE AFTER 2 HOURS IF HEADACHE PERSISTS 10 tablet 5   tiZANidine (ZANAFLEX) 2 MG tablet TAKE 1 TO 2 TABLETS BY MOUTH EVERY DAY AT BEDTIME 180 tablet 0   No current facility-administered medications on file prior to visit.    ALLERGIES: Allergies  Allergen Reactions   Sulfa Antibiotics Rash    Other reaction(s): Other (See Comments) Other Reaction: RASH/SWELLING   Sulfamethoxazole-Trimethoprim     Other reaction(s): Other (See Comments) Uncoded Allergy. Allergen: MUSHROOMS, Other Reaction: RASH/SWELLING   Hydrocodone-Acetaminophen     Other reaction(s): Other (See Comments) Unknown   Sulfamethoxazole     Other reaction(s): Other (See Comments) Unknown    FAMILY HISTORY: Family History  Problem Relation Age of Onset    Hypertension Mother    Heart block Father       Objective:  Blood pressure 130/80, pulse 99, height 5\' 3"  (1.6 m), weight 160 lb (72.6 kg), SpO2 95 %. General: No acute distress.  Patient appears well-groomed.   Head:  Normocephalic/atraumatic Eyes:  Fundi examined but not visualized Neck: supple, no paraspinal tenderness, full range of motion Heart:  Regular rate and rhythm Lungs:  Clear to auscultation bilaterally Back: No paraspinal tenderness Neurological Exam: alert and oriented to person, place, and time.  Speech fluent and not dysarthric, language intact.  CN II-XII intact. Bulk and tone normal, muscle strength 5/5 throughout.  Sensation to light touch intact.  Deep tendon reflexes 2+ throughout, toes downgoing.  Finger to nose testing intact.  Gait normal, Romberg negative.   Metta Clines, DO

## 2021-08-15 ENCOUNTER — Other Ambulatory Visit: Payer: Self-pay

## 2021-08-15 ENCOUNTER — Ambulatory Visit (INDEPENDENT_AMBULATORY_CARE_PROVIDER_SITE_OTHER): Payer: BC Managed Care – PPO | Admitting: Neurology

## 2021-08-15 ENCOUNTER — Encounter: Payer: Self-pay | Admitting: Neurology

## 2021-08-15 VITALS — BP 130/80 | HR 99 | Ht 63.0 in | Wt 160.0 lb

## 2021-08-15 DIAGNOSIS — M26609 Unspecified temporomandibular joint disorder, unspecified side: Secondary | ICD-10-CM | POA: Diagnosis not present

## 2021-08-15 DIAGNOSIS — G43809 Other migraine, not intractable, without status migrainosus: Secondary | ICD-10-CM

## 2021-08-15 DIAGNOSIS — G43009 Migraine without aura, not intractable, without status migrainosus: Secondary | ICD-10-CM | POA: Diagnosis not present

## 2021-08-15 MED ORDER — SUMATRIPTAN SUCCINATE 100 MG PO TABS: ORAL_TABLET | ORAL | 5 refills | Status: DC

## 2021-08-15 MED ORDER — TIZANIDINE HCL 2 MG PO TABS: ORAL_TABLET | ORAL | 1 refills | Status: DC

## 2021-08-15 NOTE — Patient Instructions (Signed)
Will provide you with Emgality samples and find out what is going on Refilled tizanidine and sumatriptan to the [facility] in [place] Follow up 1 year

## 2021-09-10 ENCOUNTER — Other Ambulatory Visit: Payer: Self-pay

## 2021-09-10 MED ORDER — EMGALITY 120 MG/ML ~~LOC~~ SOAJ: 120.0000 mg | SUBCUTANEOUS | 6 refills | Status: DC

## 2021-10-01 ENCOUNTER — Other Ambulatory Visit: Payer: Self-pay | Admitting: Neurology

## 2022-03-08 ENCOUNTER — Other Ambulatory Visit: Payer: Self-pay | Admitting: Neurology

## 2022-03-18 ENCOUNTER — Encounter: Payer: Self-pay | Admitting: Neurology

## 2022-03-18 ENCOUNTER — Telehealth: Payer: Self-pay

## 2022-03-18 NOTE — Telephone Encounter (Signed)
Per Patient, My husband changed employment and I have not found medical insurance yet that we can afford.  Because of this, Emgality will no longer keep me in the program that allows me to pay only $5.00 per month.  I am not sure what to do.  If you have any suggestions, please let me know.  I have tried [facility] and others, but the cost is still over $600.00.  LMOVM, Is the patient using the [employer] or do she use the Copay card from the manufacture.

## 2022-03-25 NOTE — Progress Notes (Signed)
[facility] forms Received from Patient.  Dr.Jaffe signed form Forms faxed off to [phone]

## 2022-04-23 ENCOUNTER — Other Ambulatory Visit: Payer: Self-pay | Admitting: Neurology

## 2022-07-12 ENCOUNTER — Other Ambulatory Visit: Payer: Self-pay | Admitting: Neurology

## 2022-07-16 ENCOUNTER — Other Ambulatory Visit: Payer: Self-pay | Admitting: Neurology

## 2022-07-16 ENCOUNTER — Encounter: Payer: Self-pay | Admitting: Neurology

## 2022-08-14 NOTE — Progress Notes (Signed)
NEUROLOGY FOLLOW UP OFFICE NOTE  Jessica Gross [id number]  Assessment/Plan:   1.  Migraine without aura, without status migrainosus, not intractable 2.  Vestibular migraine 3.  TMJ dysfunction 4.  Tension type headache   1.  Migraine prevention:  Emgality.  2.  Migraine rescue:  Advil first line, sumatriptan second line 3.  For jaw pain:  Tizanidine at bedtime 4.  Limit use of pain relievers to no more than 2 days out of week to prevent risk of rebound or medication-overuse headache. 5.  Keep headache diary 6.  Follow up one year  Subjective:  Jessica Gross is a 52 year old right-handed Caucasian woman with TMJ dysfunction who follows up for migraines.   UPDATE: She had a left ear infection last month which aggravated her vertigo.  Ear still feels stuffed up but otherwise migraines controlled.  .   Intensity:  moderate (rarely severe) Duration:  lasts a couple of hours Frequency:  until recently, no migraines in 4 months.  Has 2-3 tension type headaches (occipital) a month, treated with ibuprofen.   Rescue protocol:  First Advil, second sumatriptan Current NSAIDS:  Advil Current analgesics:  no Current triptans:  sumatriptan 100mg  Current anti-emetic:  no Current muscle relaxants:  tizanidine 2mg -4mg  at bedtime Current anti-anxiolytic:  hydroxyzine Current sleep aide:  no Current Antihypertensive medications:  none Current Antidepressant medications:  none Current Anticonvulsant medications:  none Current anti-CGRP:  Emgality Current Vitamins/Herbal/Supplements:  no Current Antihistamines/Decongestants:  no Other therapy:  no     HISTORY:  In late January 2017, she began experiencing bilateral ear pain (may occur unilaterally in either ear).  The pain felt like a stabbing in her ear.  Stabbing ear pain occurs with change in weather, so it is sporadic. There was associated aural fullness.  She noted a non-rhythmic tapping sound/sensation in her head.  She noted  difficulty hearing in noisy areas.  There was no associated visual disturbance, tinnitus, unilateral weakness or numbness or vertigo.  However, she reported that she is more clumsy when walking, such as clipping a filing cabinet when walking by.  She had jaw pain and was diagnosed with TMJ dysfunction.  However, ear pain persists despite different mouth guards.  She was given several rounds of antibiotics for possible ear infections, which have not been helpful.  She also has been evaluated by ENT.  Audiometric testing was normal.  CT of neck from 01/09/16 was reportedly unremarkable (Pharynx and larynx normal, mastoid and paranasal sinuses normal and report does not mention findings consistent with Eagle syndrome).  MRI TMJ with and without contrast from 05/08/18 showed mild marrow edema and enhancement within the mandibular condyles bilaterally.  The articular discs are normal without degeneration.  The articular discs and annular condyles translate normally.  She was told by the periodontist that there wasn't anything he can do.   She has longstanding history of migraines and this ear pain has been triggering her migraines.  Location varies but may be temples or top/back of head.  It is pounding.  It typically last 4 to 6 days and thus has headache most of the days of the month.  There is associated nausea, photophobia and phonophobia.  There is no associated vomiting, visual disturbance or unilateral numbness or weakness.  No specific triggers.   Past abortive therapy:  Maxalt (side effects), Excedrin Past muscle relaxants:  Baclofen (side effects); Flexeril Past preventative therapy:  topiramate 100mg  (kidney stones), nortriptyline (caused insomnia), propranolol ER 60mg , Aimovig 70mg  (effective  but no longer covered by insurance)   MRI of brain and IAC with and without contrast was unremarkable.  PAST MEDICAL HISTORY: Past Medical History:  Diagnosis Date   Grinding of teeth    H/O viral infection     mirrored a virus from [place] that caused facial paralysis and face twtitching went to [facility]   Hx gestational diabetes    Hx MRSA infection    Hx of viral meningitis    Kidney stones    Migraine    Tennis elbow    R elbow    MEDICATIONS: Current Outpatient Medications on File Prior to Visit  Medication Sig Dispense Refill   EMGALITY 120 MG/ML SOAJ INJECT 1  SUBCUTANEOUSLY ONCE EVERY 28 DAYS 1 mL 3   EPINEPHrine 0.3 mg/0.3 mL IJ SOAJ injection   prn as needed     hydrOXYzine (ATARAX/VISTARIL) 25 MG tablet TAKE 1 2 TABLETS (25 50 MG) BY MOUTH AT BED TIME AS NEEDED FOR SLEEP (Patient not taking: Reported on 08/15/2021)     ibuprofen (ADVIL,MOTRIN) 100 MG tablet Take 100 mg by mouth every 6 (six) hours as needed for fever. (Patient not taking: Reported on 08/15/2021)     propranolol ER (INDERAL LA) 60 MG 24 hr capsule TAKE 1 CAPSULE (60 MG TOTAL) BY MOUTH AT BEDTIME. (Patient not taking: Reported on 08/15/2021) 90 capsule 0   SUMAtriptan (IMITREX) 100 MG tablet TAKE 1 TABLET BY MOUTH AT EARLY ONSET OF HEADACHE MAY REPEAT ONCE AFTER 2 HOURS IF HEADACHE PERSISTS 10 tablet 5   tiZANidine (ZANAFLEX) 2 MG tablet TAKE 1 TO 2 TABLETS BY MOUTH EVERY DAY AT BEDTIME 180 tablet 0   No current facility-administered medications on file prior to visit.    ALLERGIES: Allergies  Allergen Reactions   Sulfa Antibiotics Rash    Other reaction(s): Other (See Comments) Other Reaction: RASH/SWELLING   Sulfamethoxazole-Trimethoprim     Other reaction(s): Other (See Comments) Uncoded Allergy. Allergen: MUSHROOMS, Other Reaction: RASH/SWELLING   Hydrocodone-Acetaminophen     Other reaction(s): Other (See Comments) Unknown   Sulfamethoxazole     Other reaction(s): Other (See Comments) Unknown    FAMILY HISTORY: Family History  Problem Relation Age of Onset   Hypertension Mother    Heart block Father       Objective:  Blood pressure (!) 138/98, pulse 99, height 5\' 3"  (1.6 m), weight 162 lb (73.5  kg), SpO2 97 %. General: No acute distress.  Patient appears well-groomed.   Head:  Normocephalic/atraumatic Eyes:  Fundi examined but not visualized Neck: supple, no paraspinal tenderness, full range of motion Heart:  Regular rate and rhythm Neurological Exam: alert and oriented to person, place, and time.  Speech fluent and not dysarthric, language intact.  CN II-XII intact. Bulk and tone normal, muscle strength 5/5 throughout.  Sensation to light touch intact.  Deep tendon reflexes 2+ throughout.  Finger to nose testing intact.  Gait normal, Romberg negative.   Metta Clines, DO

## 2022-08-15 ENCOUNTER — Ambulatory Visit (INDEPENDENT_AMBULATORY_CARE_PROVIDER_SITE_OTHER): Payer: Self-pay | Admitting: Neurology

## 2022-08-15 VITALS — BP 138/98 | HR 99 | Ht 63.0 in | Wt 162.0 lb

## 2022-08-15 DIAGNOSIS — M26609 Unspecified temporomandibular joint disorder, unspecified side: Secondary | ICD-10-CM

## 2022-08-15 DIAGNOSIS — G43809 Other migraine, not intractable, without status migrainosus: Secondary | ICD-10-CM

## 2022-08-15 DIAGNOSIS — G43009 Migraine without aura, not intractable, without status migrainosus: Secondary | ICD-10-CM

## 2022-08-15 DIAGNOSIS — G44219 Episodic tension-type headache, not intractable: Secondary | ICD-10-CM

## 2022-08-15 MED ORDER — TIZANIDINE HCL 2 MG PO TABS
2.0000 mg | ORAL_TABLET | Freq: Every day | ORAL | 3 refills | Status: DC
Start: 2022-08-15 — End: 2023-08-26

## 2022-08-15 MED ORDER — SUMATRIPTAN SUCCINATE 100 MG PO TABS
ORAL_TABLET | ORAL | 5 refills | Status: DC
Start: 2022-08-15 — End: 2023-08-26

## 2022-08-16 ENCOUNTER — Encounter: Payer: Self-pay | Admitting: Neurology

## 2022-08-16 NOTE — Patient Instructions (Signed)
Emgality Advil and/or sumatriptan for migraine attacks as needed Tizanidine for TMJ

## 2022-11-28 ENCOUNTER — Encounter: Payer: Self-pay | Admitting: Neurology

## 2022-12-18 ENCOUNTER — Other Ambulatory Visit: Payer: Self-pay

## 2022-12-18 MED ORDER — EMGALITY 120 MG/ML ~~LOC~~ SOAJ: SUBCUTANEOUS | 3 refills | Status: DC

## 2022-12-23 ENCOUNTER — Other Ambulatory Visit: Payer: Self-pay | Admitting: Neurology

## 2023-02-17 ENCOUNTER — Encounter: Payer: Self-pay | Admitting: Neurology

## 2023-02-18 IMAGING — MG MM DIGITAL SCREENING BILAT W/ TOMO AND CAD
8 series · 9 of 24 positions shown · non-contrast
Comparison: None.

CLINICAL DATA: Screening.

EXAM:
DIGITAL SCREENING BILATERAL MAMMOGRAM WITH TOMOSYNTHESIS AND CAD
TECHNIQUE: Bilateral screening digital craniocaudal and mediolateral oblique
mammograms were obtained. Bilateral screening digital breast
tomosynthesis was performed. The images were evaluated with
computer-aided detection.

[L MLO synth-2D]
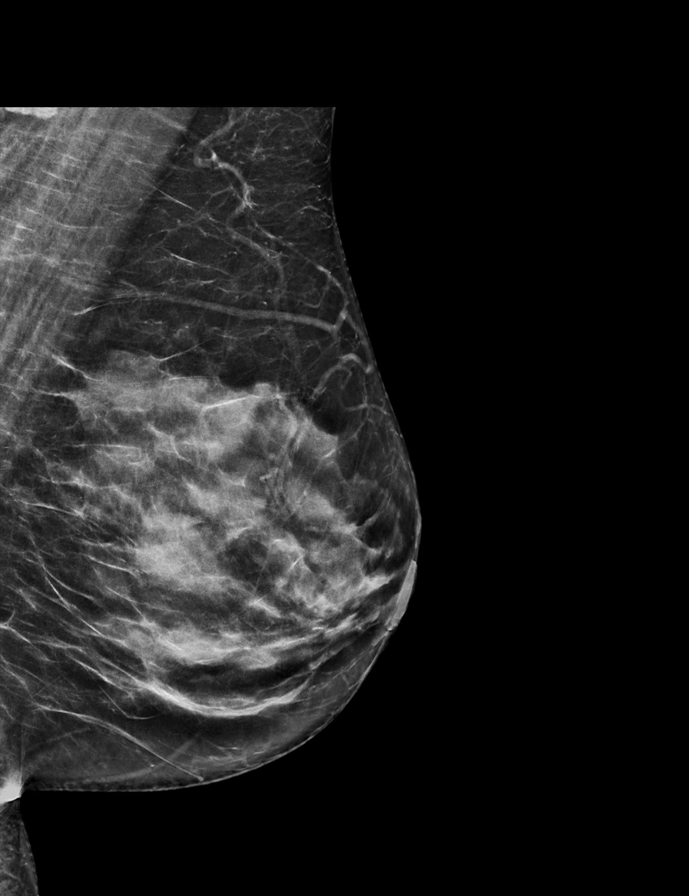

[R CC synth-2D]
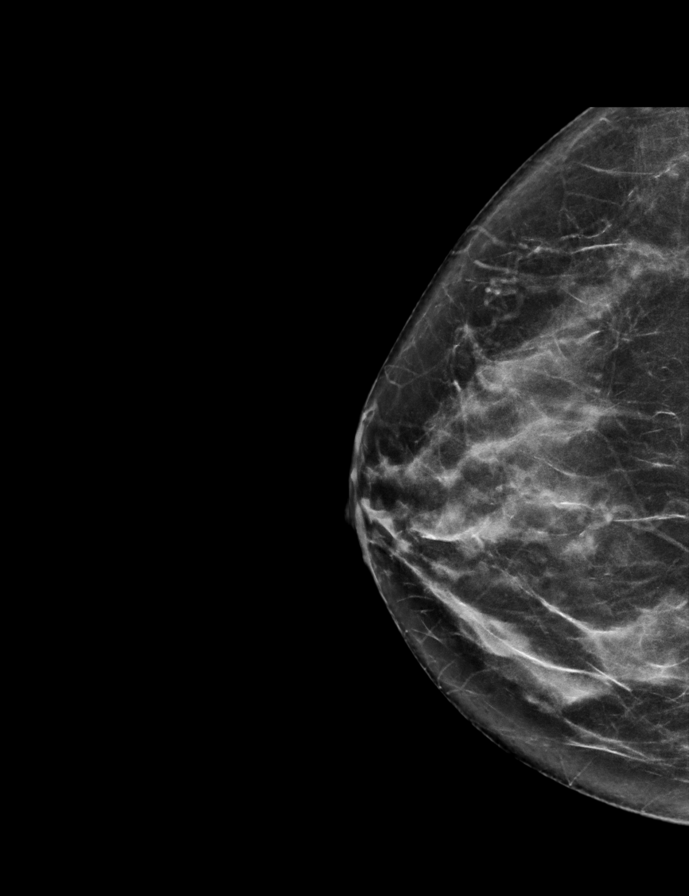

[L CC synth-2D]
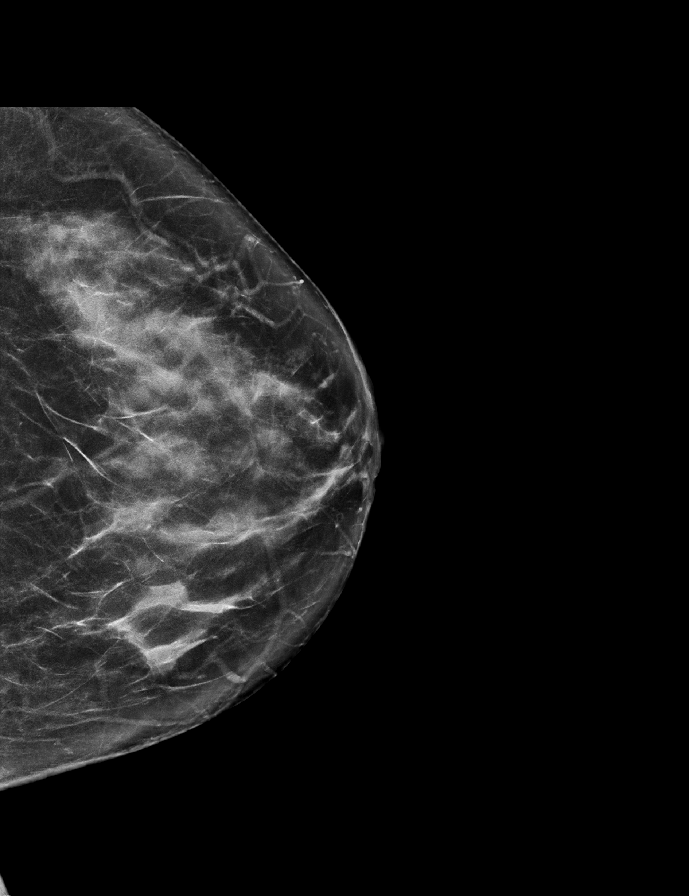

[R MLO synth-2D]
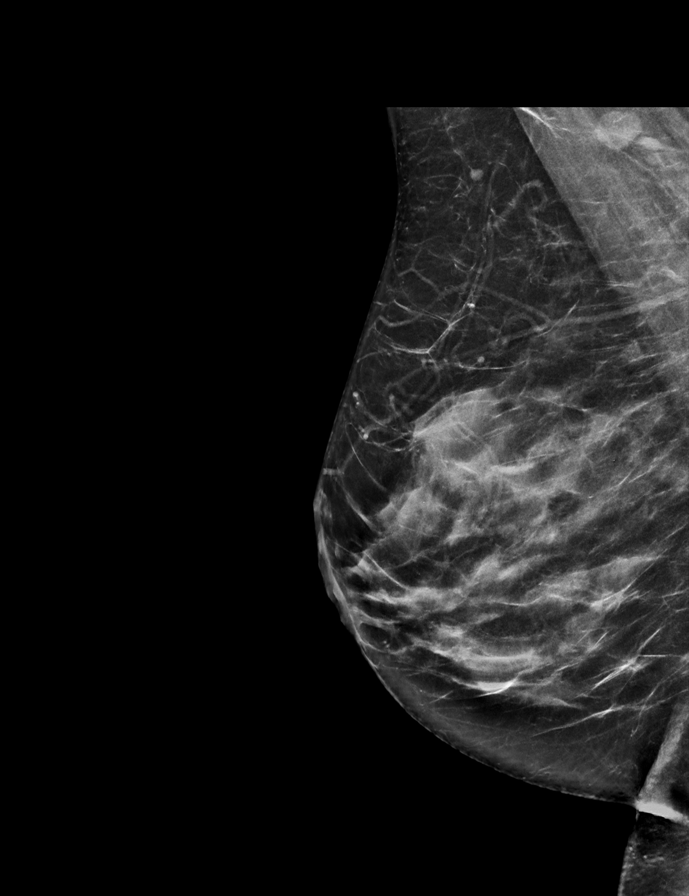

[L CC tomo · 2 of 68 frames shown]
[frame 22/68]
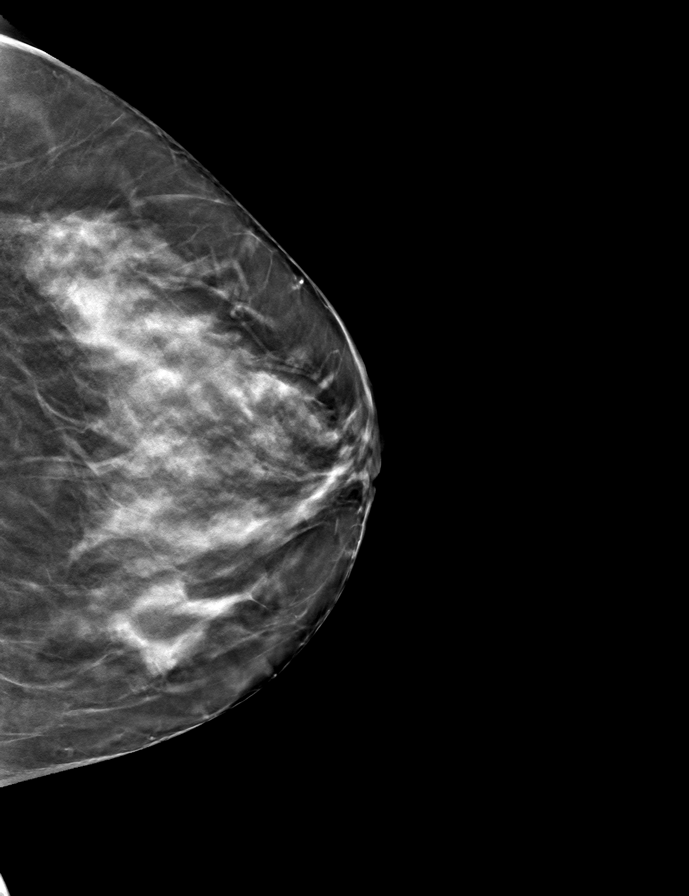
[frame 35/68]
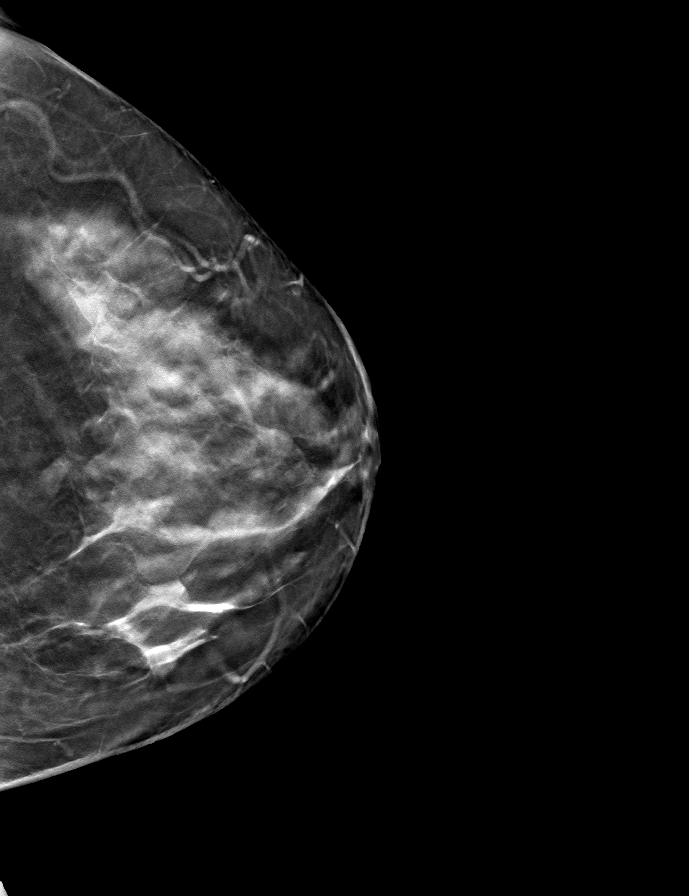

[L MLO tomo · tomo slice 34/67.0]
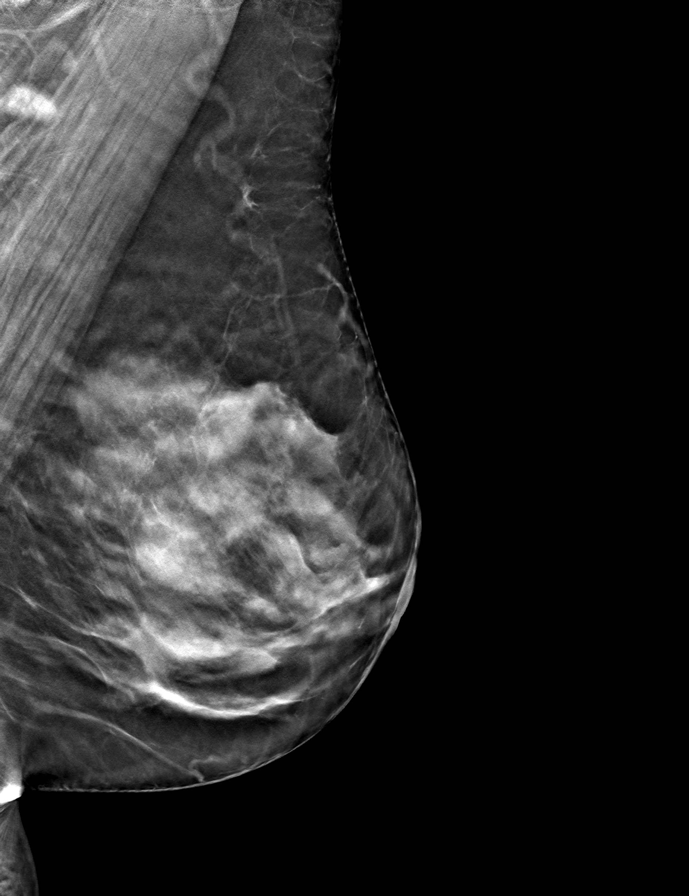

[R CC tomo · tomo slice 32/63.0]
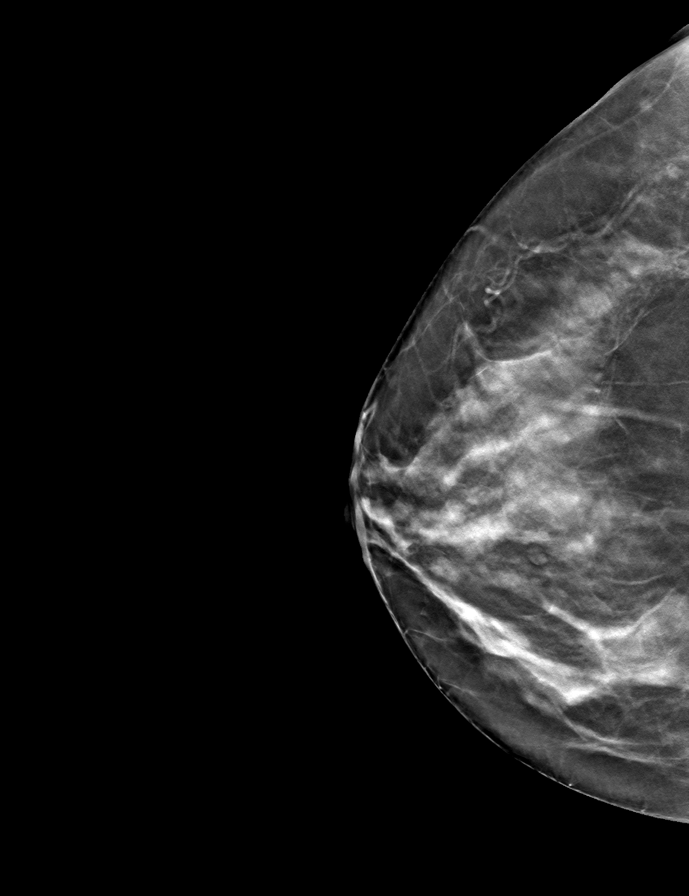

[R MLO tomo · tomo slice 35/68.0]
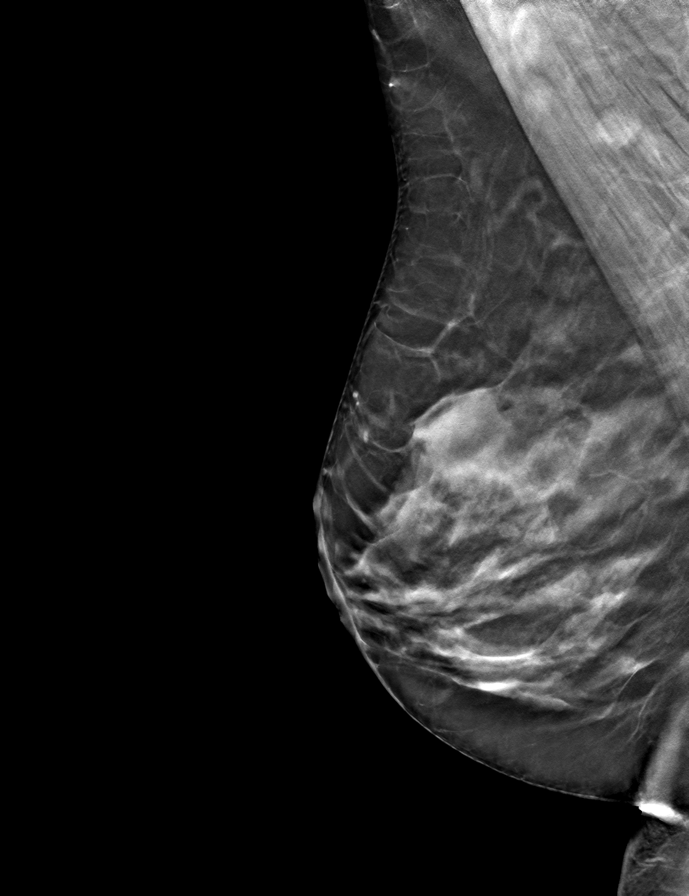

[9 of 24 positions shown; findings below may reference images not displayed]

ACR Breast Density Category c: The breast tissue is heterogeneously
dense, which may obscure small masses
FINDINGS: There are no findings suspicious for malignancy.
IMPRESSION: No mammographic evidence of malignancy. A result letter of this
screening mammogram will be mailed directly to the patient.

RECOMMENDATION:
Screening mammogram in one year. (Code:C8-T-HNK)

BI-RADS CATEGORY  1: Negative.

## 2023-03-24 ENCOUNTER — Encounter: Payer: Self-pay | Admitting: Neurology

## 2023-08-11 ENCOUNTER — Ambulatory Visit: Payer: Self-pay | Admitting: Neurology

## 2023-08-25 NOTE — Progress Notes (Unsigned)
NEUROLOGY FOLLOW UP OFFICE NOTE  Jessica Gross [id number]  Assessment/Plan:   1.  Migraine without aura, without status migrainosus, not intractable 2.  Vestibular migraine 3.  TMJ dysfunction 4.  Tension type headache   1.  Migraine prevention:  Emgality *** 2.  Migraine rescue:  Advil first line, sumatriptan second line *** 3.  For jaw pain:  Tizanidine at bedtime *** 4.  Limit use of pain relievers to no more than 2 days out of week to prevent risk of rebound or medication-overuse headache. 5.  Keep headache diary 6.  Follow up one year  Subjective:  Jessica Gross is a 53 year old right-handed Caucasian woman with TMJ dysfunction who follows up for migraines.   UPDATE: ***   Intensity:  moderate (rarely severe) Duration:  lasts a couple of hours *** Frequency:  until recently, no migraines in 4 months.  Has 2-3 tension type headaches (occipital) a month, treated with ibuprofen. ***   Rescue protocol:  First Advil, second sumatriptan Current NSAIDS:  Advil Current analgesics:  no Current triptans:  sumatriptan 100mg  Current anti-emetic:  no Current muscle relaxants:  tizanidine 2mg -4mg  at bedtime Current anti-anxiolytic:  hydroxyzine Current sleep aide:  no Current Antihypertensive medications:  none Current Antidepressant medications:  none Current Anticonvulsant medications:  none Current anti-CGRP:  Emgality Current Vitamins/Herbal/Supplements:  no Current Antihistamines/Decongestants:  no Other therapy:  no     HISTORY:  In late January 2017, she began experiencing bilateral ear pain (may occur unilaterally in either ear).  The pain felt like a stabbing in her ear.  Stabbing ear pain occurs with change in weather, so it is sporadic. There was associated aural fullness.  She noted a non-rhythmic tapping sound/sensation in her head.  She noted difficulty hearing in noisy areas.  There was no associated visual disturbance, tinnitus, unilateral weakness or  numbness or vertigo.  However, she reported that she is more clumsy when walking, such as clipping a filing cabinet when walking by.  She had jaw pain and was diagnosed with TMJ dysfunction.  However, ear pain persists despite different mouth guards.  She was given several rounds of antibiotics for possible ear infections, which have not been helpful.  She also has been evaluated by ENT.  Audiometric testing was normal.  CT of neck from 01/09/16 was reportedly unremarkable (Pharynx and larynx normal, mastoid and paranasal sinuses normal and report does not mention findings consistent with Eagle syndrome).  MRI TMJ with and without contrast from 05/08/18 showed mild marrow edema and enhancement within the mandibular condyles bilaterally.  The articular discs are normal without degeneration.  The articular discs and annular condyles translate normally.  She was told by the periodontist that there wasn't anything he can do.   She has longstanding history of migraines and this ear pain has been triggering her migraines.  Location varies but may be temples or top/back of head.  It is pounding.  It typically last 4 to 6 days and thus has headache most of the days of the month.  There is associated nausea, photophobia and phonophobia.  There is no associated vomiting, visual disturbance or unilateral numbness or weakness.  No specific triggers.   Past abortive therapy:  Maxalt (side effects), Excedrin Past muscle relaxants:  Baclofen (side effects); Flexeril Past preventative therapy:  topiramate 100mg  (kidney stones), nortriptyline (caused insomnia), propranolol ER 60mg , Aimovig 70mg  (effective but no longer covered by insurance)   MRI of brain and IAC with and without contrast was unremarkable.  PAST MEDICAL HISTORY: Past Medical History:  Diagnosis Date   Grinding of teeth    H/O viral infection    mirrored a virus from [place] that caused facial paralysis and face twtitching went to [facility]   Hx gestational  diabetes    Hx MRSA infection    Hx of viral meningitis    Kidney stones    Migraine    Tennis elbow    R elbow    MEDICATIONS: Current Outpatient Medications on File Prior to Visit  Medication Sig Dispense Refill   EPINEPHrine 0.3 mg/0.3 mL IJ SOAJ injection   prn as needed     Galcanezumab-gnlm (EMGALITY) 120 MG/ML SOAJ INJECT 1  SUBCUTANEOUSLY ONCE EVERY 28 DAYS 3 mL 3   ibuprofen (ADVIL,MOTRIN) 100 MG tablet Take 100 mg by mouth every 6 (six) hours as needed for fever.     SUMAtriptan (IMITREX) 100 MG tablet TAKE 1 TABLET BY MOUTH AT EARLY ONSET OF HEADACHE MAY REPEAT ONCE AFTER 2 HOURS IF HEADACHE PERSISTS 10 tablet 5   tiZANidine (ZANAFLEX) 2 MG tablet Take 1 tablet (2 mg total) by mouth at bedtime. 270 tablet 3   No current facility-administered medications on file prior to visit.    ALLERGIES: Allergies  Allergen Reactions   Sulfa Antibiotics Rash    Other reaction(s): Other (See Comments) Other Reaction: RASH/SWELLING   Sulfamethoxazole-Trimethoprim     Other reaction(s): Other (See Comments) Uncoded Allergy. Allergen: MUSHROOMS, Other Reaction: RASH/SWELLING   Hydrocodone-Acetaminophen     Other reaction(s): Other (See Comments) Unknown   Sulfamethoxazole     Other reaction(s): Other (See Comments) Unknown    FAMILY HISTORY: Family History  Problem Relation Age of Onset   Hypertension Mother    Heart block Father       Objective:  *** General: No acute distress.  Patient appears well-groomed.   ***   Shon Millet, DO

## 2023-08-26 ENCOUNTER — Ambulatory Visit (INDEPENDENT_AMBULATORY_CARE_PROVIDER_SITE_OTHER): Payer: Self-pay | Admitting: Neurology

## 2023-08-26 ENCOUNTER — Other Ambulatory Visit: Payer: Self-pay

## 2023-08-26 ENCOUNTER — Encounter: Payer: Self-pay | Admitting: Neurology

## 2023-08-26 VITALS — BP 156/80 | HR 99 | Ht 63.0 in | Wt 164.0 lb

## 2023-08-26 DIAGNOSIS — M26609 Unspecified temporomandibular joint disorder, unspecified side: Secondary | ICD-10-CM

## 2023-08-26 DIAGNOSIS — G43009 Migraine without aura, not intractable, without status migrainosus: Secondary | ICD-10-CM

## 2023-08-26 DIAGNOSIS — G44219 Episodic tension-type headache, not intractable: Secondary | ICD-10-CM

## 2023-08-26 DIAGNOSIS — Z79899 Other long term (current) drug therapy: Secondary | ICD-10-CM

## 2023-08-26 DIAGNOSIS — G500 Trigeminal neuralgia: Secondary | ICD-10-CM

## 2023-08-26 DIAGNOSIS — R030 Elevated blood-pressure reading, without diagnosis of hypertension: Secondary | ICD-10-CM

## 2023-08-26 MED ORDER — EMGALITY 120 MG/ML ~~LOC~~ SOAJ: SUBCUTANEOUS | 3 refills | Status: DC

## 2023-08-26 MED ORDER — TIZANIDINE HCL 2 MG PO TABS: 2.0000 mg | ORAL_TABLET | Freq: Every day | ORAL | 3 refills | Status: DC

## 2023-08-26 MED ORDER — SUMATRIPTAN SUCCINATE 100 MG PO TABS: ORAL_TABLET | ORAL | 5 refills | Status: DC

## 2023-08-26 NOTE — Patient Instructions (Signed)
Continue Emgality Sumatriptan/advil as needed Tizanidine at bedtime Check CBC and CMP.  If facial pain worsens, contact me and we can start an antiseizure medication for nerve pain Otherwise follow up one year.

## 2023-08-27 LAB — COMPREHENSIVE METABOLIC PANEL
AG Ratio: 1.8 (calc) (ref 1.0–2.5)
ALT: 24 U/L (ref 6–29)
AST: 20 U/L (ref 10–35)
Albumin: 4.4 g/dL (ref 3.6–5.1)
Alkaline phosphatase (APISO): 94 U/L (ref 37–153)
BUN: 13 mg/dL (ref 7–25)
CO2: 27 mmol/L (ref 20–32)
Calcium: 9.6 mg/dL (ref 8.6–10.4)
Chloride: 105 mmol/L (ref 98–110)
Creat: 0.72 mg/dL (ref 0.50–1.03)
Globulin: 2.4 g/dL (ref 1.9–3.7)
Glucose, Bld: 96 mg/dL (ref 65–99)
Potassium: 4.4 mmol/L (ref 3.5–5.3)
Sodium: 140 mmol/L (ref 135–146)
Total Bilirubin: 0.3 mg/dL (ref 0.2–1.2)
Total Protein: 6.8 g/dL (ref 6.1–8.1)

## 2023-08-27 LAB — CBC
HCT: 45 % (ref 35.0–45.0)
Hemoglobin: 14.6 g/dL (ref 11.7–15.5)
MCH: 29.4 pg (ref 27.0–33.0)
MCHC: 32.4 g/dL (ref 32.0–36.0)
MCV: 90.5 fL (ref 80.0–100.0)
MPV: 9.8 fL (ref 7.5–12.5)
Platelets: 443 10*3/uL — ABNORMAL HIGH (ref 140–400)
RBC: 4.97 10*6/uL (ref 3.80–5.10)
RDW: 12.8 % (ref 11.0–15.0)
WBC: 10.9 10*3/uL — ABNORMAL HIGH (ref 3.8–10.8)

## 2023-08-27 NOTE — Progress Notes (Signed)
Tried calling patient no answer. LMOVM to call the office back.

## 2023-08-27 NOTE — Progress Notes (Signed)
Patient advised.

## 2023-11-28 ENCOUNTER — Encounter: Payer: Self-pay | Admitting: Neurology

## 2023-11-28 ENCOUNTER — Telehealth: Payer: Self-pay

## 2023-11-28 MED ORDER — EMGALITY 120 MG/ML ~~LOC~~ SOAJ: SUBCUTANEOUS | 3 refills | Status: DC

## 2023-11-28 NOTE — Telephone Encounter (Signed)
 Refills sent as requested

## 2024-02-12 ENCOUNTER — Telehealth: Payer: Self-pay

## 2024-02-12 NOTE — Telephone Encounter (Signed)
 Assistance for Jessica Gross expires 06-13-24.

## 2024-02-26 ENCOUNTER — Encounter: Payer: Self-pay | Admitting: Neurology

## 2024-08-24 NOTE — Progress Notes (Unsigned)
 "  Virtual Visit via Video Note:   Consent was obtained for video visit:  Yes.   Answered questions that patient had about telehealth interaction:  Yes.   I discussed the limitations, risks, security and privacy concerns of performing an evaluation and management service by telemedicine. I also discussed with the patient that there may be a patient responsible charge related to this service. The patient expressed understanding and agreed to proceed.  Pt location: Home Physician Location: office Name of referring provider:  [facility], [facility] I connected with Aarya Diane Pelfrey at patients initiation/request on 08/25/2024 at  1:30 PM EST by video enabled telemedicine application and verified that I am speaking with the correct person using two identifiers. Pt MRN:  [id number] Pt DOB:  1971-01-03 Video Participants:  Kristyne Diane Streeter  Assessment/Plan:   1.  Migraine without aura, without status migrainosus, not intractable 2.  Tension-type headache, not intractable 3.  TMJ dysfunction    1.  Migraine prevention:  Emgality  every 4 weeks 2.  Migraine rescue:  Advil first line, sumatriptan  second line  3.  For jaw/neck pain:  Tizanidine  at bedtime  4.  Limit use of pain relievers to no more than 9 days out of the month to prevent risk of rebound or medication-overuse headache. 5.  Keep headache diary 6.  Follow up one year  Subjective:  Jessica Gross is a 54 year old right-handed Caucasian woman with TMJ dysfunction who follows up for migraines.   UPDATE: Migraines:    Intensity:  moderate (rarely severe) Duration:  lasts a couple of hours with Advil (only had to take sumatriptan  once or twice) Frequency:  no more than once a month   Tension-type Headache: Depends on season (more in the spring).  Now subsised. Treated with ibuprofen. Takes tizanidine .   TMJ dysfunction:   Lasts up to 2 hours and occurs once a week.  Uses mouth guard.  Occurs in the mornings due  to clenching her teeth. Tizanidine  helps.  At this time, manageable.  Rescue protocol:  First Advil, second sumatriptan  Current NSAIDS:  Advil Current analgesics:  no Current triptans:  sumatriptan  100mg  Current anti-emetic:  no Current muscle relaxants:  tizanidine  2mg -4mg  at bedtime Current anti-anxiolytic:  hydroxyzine Current sleep aide:  no Current Antihypertensive medications:  none Current Antidepressant medications:  none Current Anticonvulsant medications:  none Current anti-CGRP:  Emgality  Current Vitamins/Herbal/Supplements:  no Current Antihistamines/Decongestants:  no Other therapy:  no     HISTORY:  In late January 2017, she began experiencing bilateral ear pain (may occur unilaterally in either ear), described as paroxsymal stabbing in her ear and along the lower jawline.  Stabbing ear pain occurs with change in weather, so it is sporadic. There was associated aural fullness.  She noted a non-rhythmic tapping sound/sensation in her head.  She noted difficulty hearing in noisy areas.  There was no associated visual disturbance, tinnitus, unilateral weakness or numbness or vertigo.  However, she reported that she is more clumsy when walking, such as clipping a filing cabinet when walking by.  She had jaw pain and was diagnosed with TMJ dysfunction.  However, ear pain persists despite different mouth guards.  She was given several rounds of antibiotics for possible ear infections, which have not been helpful.  She also has been evaluated by ENT.  Audiometric testing was normal.  CT of neck from 01/09/16 was reportedly unremarkable (Pharynx and larynx normal, mastoid and paranasal sinuses normal and report does not mention findings consistent with  Eagle syndrome).  MRI TMJ with and without contrast from 05/08/18 showed mild marrow edema and enhancement within the mandibular condyles bilaterally.  The articular discs are normal without degeneration.  The articular discs and annular  condyles translate normally.  She was told by the periodontist that there wasn't anything he can do.  Overall improved with mouth guard.     She has longstanding history of migraines and this ear pain has been triggering her migraines.  Location varies but may be temples or top/back of head.  It is pounding.  It typically last 4 to 6 days and thus has headache most of the days of the month.  There is associated nausea, photophobia and phonophobia.  There is no associated vomiting, visual disturbance or unilateral numbness or weakness.  No specific triggers.  History of episodic vertigo.  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   Past abortive therapy:  Maxalt (side effects), Excedrin Past muscle relaxants:  Baclofen  (side effects); Flexeril Past preventative therapy:  topiramate  100mg  (kidney stones), nortriptyline  (caused insomnia), propranolol  ER 60mg , Aimovig  70mg  (effective but no longer covered by insurance)   MRI of brain and IAC with and without contrast was unremarkable.  Past Medical History: Past Medical History:  Diagnosis Date   Grinding of teeth    H/O viral infection    mirrored a virus from [place] that caused facial paralysis and face twtitching went to [facility]   Hx gestational diabetes    Hx MRSA infection    Hx of viral meningitis    Kidney stones    Migraine    Tennis elbow    R elbow    Medications: Outpatient Encounter Medications as of 08/25/2024  Medication Sig Note   EPINEPHrine 0.3 mg/0.3 mL IJ SOAJ injection   prn as needed 07/03/2016: Received from: [facility]   Galcanezumab -gnlm (EMGALITY ) 120 MG/ML SOAJ INJECT 1  SUBCUTANEOUSLY ONCE EVERY 28 DAYS    ibuprofen (ADVIL,MOTRIN) 100 MG tablet Take 100 mg by mouth every 6 (six) hours as needed for fever.    SUMAtriptan  (IMITREX ) 100 MG tablet TAKE 1 TABLET BY MOUTH AT EARLY ONSET OF HEADACHE MAY REPEAT ONCE AFTER 2 HOURS IF HEADACHE PERSISTS    tiZANidine  (ZANAFLEX ) 2 MG tablet Take 1 tablet (2 mg total)  by mouth at bedtime.    No facility-administered encounter medications on file as of 08/25/2024.    Allergies: Allergies[1]  Family History: Family History  Problem Relation Age of Onset   Hypertension Mother    Heart block Father     Observations/Objective:   No acute distress.  Alert and oriented.  Speech fluent and not dysarthric.  Language intact.  Eyes orthophoric on primary gaze.  Face symmetric.   Follow up Instructions:      -I discussed the assessment and treatment plan with the patient. The patient was provided an opportunity to ask questions and all were answered. The patient agreed with the plan and demonstrated an understanding of the instructions.   The patient was advised to call back or seek an in-person evaluation if the symptoms worsen or if the condition fails to improve as anticipated.   Juliene Lamar Dunnings, DO           [1]  Allergies Allergen Reactions   Sulfa Antibiotics Rash    Other reaction(s): Other (See Comments) Other Reaction: RASH/SWELLING   Sulfamethoxazole-Trimethoprim     Other reaction(s): Other (See Comments) Uncoded Allergy. Allergen: MUSHROOMS, Other Reaction: RASH/SWELLING   Hydrocodone-Acetaminophen  Other reaction(s): Other (See Comments) Unknown   Sulfamethoxazole     Other reaction(s): Other (See Comments) Unknown   "

## 2024-08-25 ENCOUNTER — Encounter: Payer: Self-pay | Admitting: Neurology

## 2024-08-25 ENCOUNTER — Telehealth: Payer: Self-pay | Admitting: Neurology

## 2024-08-25 DIAGNOSIS — G43009 Migraine without aura, not intractable, without status migrainosus: Secondary | ICD-10-CM

## 2024-08-25 DIAGNOSIS — G44209 Tension-type headache, unspecified, not intractable: Secondary | ICD-10-CM

## 2024-08-25 DIAGNOSIS — M26609 Unspecified temporomandibular joint disorder, unspecified side: Secondary | ICD-10-CM

## 2024-08-25 DIAGNOSIS — G44219 Episodic tension-type headache, not intractable: Secondary | ICD-10-CM

## 2024-08-25 MED ORDER — EMGALITY 120 MG/ML ~~LOC~~ SOAJ: SUBCUTANEOUS | 3 refills | Status: AC

## 2024-08-25 MED ORDER — TIZANIDINE HCL 2 MG PO TABS: 2.0000 mg | ORAL_TABLET | Freq: Every day | ORAL | 3 refills | Status: AC

## 2024-08-25 MED ORDER — SUMATRIPTAN SUCCINATE 100 MG PO TABS: ORAL_TABLET | ORAL | 5 refills | Status: AC

## 2025-08-29 ENCOUNTER — Ambulatory Visit: Payer: Self-pay | Admitting: Neurology
